# Patient Record
Sex: Male | Born: 1945 | Race: White | Hispanic: No | Smoking: Current every day smoker
Health system: Southern US, Community
[De-identification: ages and names within clinical notes are randomized; demographics above are authoritative.]

## PROBLEM LIST (undated history)

## (undated) DIAGNOSIS — I639 Cerebral infarction, unspecified: Secondary | ICD-10-CM

## (undated) DIAGNOSIS — I10 Essential (primary) hypertension: Secondary | ICD-10-CM

## (undated) HISTORY — PX: HIP ARTHROPLASTY: SHX981

---

## 2017-02-21 ENCOUNTER — Encounter (HOSPITAL_COMMUNITY): Payer: Self-pay | Admitting: Nurse Practitioner

## 2017-02-21 ENCOUNTER — Emergency Department (HOSPITAL_COMMUNITY): Payer: Medicare Other

## 2017-02-21 ENCOUNTER — Emergency Department (HOSPITAL_COMMUNITY)
Admission: EM | Admit: 2017-02-21 | Discharge: 2017-02-22 | Disposition: A | Discharge status: Home / Self Care | Payer: Medicare Other | Attending: Emergency Medicine | Admitting: Emergency Medicine

## 2017-02-21 DIAGNOSIS — F17210 Nicotine dependence, cigarettes, uncomplicated: Secondary | ICD-10-CM | POA: Diagnosis not present

## 2017-02-21 DIAGNOSIS — N3000 Acute cystitis without hematuria: Secondary | ICD-10-CM | POA: Diagnosis not present

## 2017-02-21 DIAGNOSIS — R262 Difficulty in walking, not elsewhere classified: Secondary | ICD-10-CM | POA: Insufficient documentation

## 2017-02-21 DIAGNOSIS — M25551 Pain in right hip: Secondary | ICD-10-CM

## 2017-02-21 HISTORY — DX: Cerebral infarction, unspecified: I63.9

## 2017-02-21 HISTORY — DX: Essential (primary) hypertension: I10

## 2017-02-21 LAB — URINALYSIS, ROUTINE W REFLEX MICROSCOPIC
Bilirubin Urine: NEGATIVE
Glucose, UA: NEGATIVE mg/dL
Ketones, ur: NEGATIVE mg/dL
Nitrite: NEGATIVE
Protein, ur: 30 mg/dL — AB
Specific Gravity, Urine: 1.01 (ref 1.005–1.030)
Squamous Epithelial / LPF: NONE SEEN
pH: 7 (ref 5.0–8.0)

## 2017-02-21 LAB — CBC WITH DIFFERENTIAL/PLATELET
Basophils Absolute: 0 10*3/uL (ref 0.0–0.1)
Basophils Relative: 1 %
Eosinophils Absolute: 0.2 10*3/uL (ref 0.0–0.7)
Eosinophils Relative: 3 %
HCT: 31.6 % — ABNORMAL LOW (ref 39.0–52.0)
Hemoglobin: 9.6 g/dL — ABNORMAL LOW (ref 13.0–17.0)
Lymphocytes Relative: 14 %
Lymphs Abs: 0.9 10*3/uL (ref 0.7–4.0)
MCH: 24.9 pg — ABNORMAL LOW (ref 26.0–34.0)
MCHC: 30.4 g/dL (ref 30.0–36.0)
MCV: 81.9 fL (ref 78.0–100.0)
Monocytes Absolute: 0.9 10*3/uL (ref 0.1–1.0)
Monocytes Relative: 13 %
Neutro Abs: 4.6 10*3/uL (ref 1.7–7.7)
Neutrophils Relative %: 69 %
Platelets: 235 10*3/uL (ref 150–400)
RBC: 3.86 MIL/uL — ABNORMAL LOW (ref 4.22–5.81)
RDW: 16.2 % — ABNORMAL HIGH (ref 11.5–15.5)
WBC: 6.6 10*3/uL (ref 4.0–10.5)

## 2017-02-21 LAB — CK: Total CK: 102 U/L (ref 49–397)

## 2017-02-21 LAB — BASIC METABOLIC PANEL
Anion gap: 8 (ref 5–15)
BUN: 18 mg/dL (ref 6–20)
CO2: 29 mmol/L (ref 22–32)
Calcium: 9 mg/dL (ref 8.9–10.3)
Chloride: 100 mmol/L — ABNORMAL LOW (ref 101–111)
Creatinine, Ser: 1.56 mg/dL — ABNORMAL HIGH (ref 0.61–1.24)
GFR calc Af Amer: 51 mL/min — ABNORMAL LOW (ref >60)
GFR calc non Af Amer: 44 mL/min — ABNORMAL LOW (ref >60)
Glucose, Bld: 97 mg/dL (ref 65–99)
Potassium: 3.1 mmol/L — ABNORMAL LOW (ref 3.5–5.1)
Sodium: 137 mmol/L (ref 135–145)

## 2017-02-21 MED ORDER — MORPHINE SULFATE (PF) 4 MG/ML IV SOLN
4.0000 mg | Freq: Once | INTRAVENOUS | Status: AC
  Administered 2017-02-21: 4 mg via INTRAVENOUS
  Filled 2017-02-21: qty 1

## 2017-02-21 MED ORDER — CEPHALEXIN 500 MG PO CAPS
500.0000 mg | ORAL_CAPSULE | Freq: Once | ORAL | Status: AC
  Administered 2017-02-21: 500 mg via ORAL
  Filled 2017-02-21: qty 1

## 2017-02-21 MED ORDER — MORPHINE SULFATE (PF) 2 MG/ML IV SOLN
2.0000 mg | Freq: Once | INTRAVENOUS | Status: AC
  Administered 2017-02-21: 2 mg via INTRAVENOUS
  Filled 2017-02-21: qty 1

## 2017-02-21 MED ORDER — SODIUM CHLORIDE 0.9 % IV BOLUS (SEPSIS)
1000.0000 mL | Freq: Once | INTRAVENOUS | Status: AC
Start: 2017-02-21 — End: 2017-02-21
  Administered 2017-02-21: 1000 mL via INTRAVENOUS

## 2017-02-21 MED ORDER — CEPHALEXIN 500 MG PO CAPS: 500.0000 mg | ORAL_CAPSULE | Freq: Three times a day (TID) | ORAL | 0 refills | Status: AC

## 2017-02-21 NOTE — ED Notes (Signed)
Bed: [id number] Expected date:  Expected time:  Means of arrival:  Comments: Boarder in 19

## 2017-02-21 NOTE — Progress Notes (Signed)
ED CSW just arrived on duty.  Consult request has been received. CSW attempting to follow up at present time.  Dorothe PeaJonathan F. Vayda Dungee, Francesco SorLCSWA, LCAS, CSI Clinical Social Worker Ph: [phone]

## 2017-02-21 NOTE — ED Triage Notes (Signed)
Pt arrived via ems with R hip pain. Pt is homeless and was picked up from a hotel. He uses a walker. Pt states he does have a hx of htn and not taking medications over the past 2 weeks. Pt reports he has only eaten a piece of chicken over the past 3 days and otherwise only water.  A&O x4, patient reports he is currently unable to ambulate due to the pain. Denies any falls or injury to hip other than hip in 2014. Patient reports to ems that pain is chronic but seems worst over the past 2-3 days. VS: 150/92, CBG 113, 80, 18, 99% RA,  NSR.

## 2017-02-21 NOTE — ED Provider Notes (Signed)
[facility] DEPT Provider Note   CSN: [id number] Arrival date & time: 02/21/17  1241     History   Chief Complaint Chief Complaint  Patient presents with  . Hip Pain    HPI Robert Romero is a 71 y.o. male with history of hypertension who presents today with chief complaint acute worsening of chronic right hip pain secondary to fall 3 days ago. He states that he has had right hip pain for the past 4 years and states "I broke my hip and femur twice. I have 5 screws in my hip and a plate in my ankle and ever since then it's all gone to [place]". He states that he uses a walker to ambulate, but 3 days ago he tripped and fell forward. He denies hitting his head or losing consciousness. Since then he has been experiencing constant sharp right hip pain which radiates to his knee. He states he has not been able to ambulate due to the pain. Endorses numbness to this area which is constant and unchanged for several years. Has not tried anything for his symptoms. Also states he cannot straighten his leg, and has to keep his right knee flexed, but states this has been chronic for years. He is a pack a day smoker for several decades. States he has not been able to have his hypertension medication for over 2 weeks now, and states he is homeless and has only had a piece of chicken to eat in 2 days. Denies abdominal pain, n/v/d, back pain, CP, SOB, or headache.  The history is provided by the patient.    Past Medical History:  Diagnosis Date  . Hypertension   . Stroke [facility])     There are no active problems to display for this patient.   Past Surgical History:  Procedure Laterality Date  . HIP ARTHROPLASTY Right        Home Medications    Prior to Admission medications   Not on File    Family History History reviewed. No pertinent family history.  Social History Social History  Substance Use Topics  . Smoking status: Current Every Day Smoker    Packs/day: 0.50    Types: Cigarettes  .  Smokeless tobacco: Never Used  . Alcohol use 1.8 - 2.4 oz/week    3 - 4 Cans of beer per week     Comment: drinks when he can get money to get beer. Last beer on 9/5 2 cans      Allergies   Patient has no known allergies.   Review of Systems Review of Systems  Constitutional: Negative for chills and fever.  Respiratory: Negative for shortness of breath.   Cardiovascular: Negative for chest pain.  Gastrointestinal: Negative for abdominal pain, diarrhea, nausea and vomiting.  Musculoskeletal: Positive for arthralgias (r hip) and gait problem. Negative for back pain.  Neurological: Positive for weakness (right hip, chronic) and numbness (chronic, unchanged). Negative for syncope and headaches.     Physical Exam Updated Vital Signs BP (!) 168/95 (BP Location: Left Arm)   Pulse 72   Temp 97.6 F (36.4 C) (Oral)   Resp 16   Ht  (1.88 m)   Wt 68 kg (150 lb)   SpO2 100%   BMI 19.26 kg/m   Physical Exam  Constitutional: No distress.  Chronically ill-appearing, very thin. Fingernails and toenails are very long and dirty. Unkempt appearance.  HENT:  Head: Normocephalic and atraumatic.  Eyes: Pupils are equal, round, and reactive to  light. Conjunctivae and EOM are normal. Right eye exhibits no discharge. Left eye exhibits no discharge.  Neck: Normal range of motion. Neck supple. No tracheal deviation present.  Cardiovascular: Normal rate, regular rhythm and normal heart sounds.   Diminished DP/PT pulses equally and bilaterally,  Homans sign absent bilaterally  Pulmonary/Chest: Effort normal. He has wheezes. He exhibits no tenderness.  No increased work of breathing, scattered wheezes and rhonchi diffusely, has barrel chest  Abdominal: Soft. Bowel sounds are normal. He exhibits no distension. There is no tenderness.  Musculoskeletal:  Right hip with decreased range of motion in all directions. 4+/5 strength of right hip flexor and lateral hip motion against resistant. 5/5  strength of the BLE major muscle groups otherwise including bl EHL. Keeps R hip and knee flexed, unable to straighten leg so unable to assess leg length discrepancy. TTP along the lateral right thigh without deformity, crepitus, or swelling.   No midline spine TTP, no paraspinal muscle tenderness, no deformity, crepitus, or step-off noted. No SIJ tenderness.   Neurological: He is alert. A sensory deficit is present.  Alert and oriented to person only. Has some difficulty answering questions regarding time. Fluent speech, no facial droop, decreased sensation of the right lower extremity on soft touch as compared to the left lower extremity. He says this has been chronic for several years. Refuses to ambulate due to pain.  Skin: Skin is warm and dry. No erythema.  Psychiatric: He has a normal mood and affect. His behavior is normal.     ED Treatments / Results  Labs (all labs ordered are listed, but only abnormal results are displayed) Labs Reviewed  CBC WITH DIFFERENTIAL/PLATELET - Abnormal; Notable for the following:       Result Value   RBC 3.86 (*)    Hemoglobin 9.6 (*)    HCT 31.6 (*)    MCH 24.9 (*)    RDW 16.2 (*)    All other components within normal limits  BASIC METABOLIC PANEL - Abnormal; Notable for the following:    Potassium 3.1 (*)    Chloride 100 (*)    Creatinine, Ser 1.56 (*)    GFR calc non Af Amer 44 (*)    GFR calc Af Amer 51 (*)    All other components within normal limits  URINALYSIS, ROUTINE W REFLEX MICROSCOPIC - Abnormal; Notable for the following:    APPearance HAZY (*)    Hgb urine dipstick SMALL (*)    Protein, ur 30 (*)    Leukocytes, UA MODERATE (*)    Bacteria, UA MANY (*)    All other components within normal limits  URINE CULTURE  CK    EKG  EKG Interpretation None       Radiology Ct Head Wo Contrast  Result Date: 02/21/2017 CLINICAL DATA:  Unable to ambulate. Hypertension. Poor p.o. intake. EXAM: CT HEAD WITHOUT CONTRAST TECHNIQUE:  Contiguous axial images were obtained from the base of the skull through the vertex without intravenous contrast. COMPARISON:  None. FINDINGS: Brain: There is mild generalized age-related parenchymal atrophy with commensurate dilatation of the ventricles and sulci. Chronic small vessel ischemic changes noted within the bilateral periventricular and subcortical white matter regions. 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 There is no mass, hemorrhage, edema or other evidence of acute parenchymal abnormality. No extra-axial hemorrhage. Vascular: There are chronic calcified atherosclerotic changes of the large vessels at the skull base. No unexpected hyperdense vessel. Skull: Normal. Negative for fracture or focal lesion.  Sinuses/Orbits: No acute finding. Other: None. IMPRESSION: 1. No acute findings.  No intracranial mass, hemorrhage or edema. 2. Chronic small vessel ischemic changes in the white matter and right thalamus. Electronically Signed   By: Bary Richard M.D.   On: 02/21/2017 14:57   Dg Hip Unilat With Pelvis 2-3 Views Right  Result Date: 02/21/2017 CLINICAL DATA:  Right hip pain after fall several days ago. EXAM: DG HIP (WITH OR WITHOUT PELVIS) 2-3V RIGHT COMPARISON:  None. FINDINGS: Status post surgical internal fixation of old proximal femoral neck fracture. Severe degenerative joint disease is seen involving the right hip joint. Vascular calcifications are noted. No evidence of acute fracture or dislocation is seen involving the right femur. IMPRESSION: Status post surgical internal fixation of old proximal femoral neck fracture. No acute right femur fracture is noted. 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 Electronically Signed   By: Lupita Raider, M.D.   On: 02/21/2017 15:10   Dg Femur 1v Right  Result Date: 02/21/2017 CLINICAL DATA:  Right hip pain after fall several days ago. EXAM: RIGHT FEMUR 1 VIEW COMPARISON:  None. FINDINGS: Severe degenerative joint disease  of the right hip joint is noted. Status post surgical internal fixation of old proximal right femoral neck fracture. No acute fracture or dislocation is noted. IMPRESSION: Severe degenerative joint disease of the right hip. Status post surgical internal fixation of old proximal right femoral neck fracture. No acute abnormality is noted. Electronically Signed   By: Lupita Raider, M.D.   On: 02/21/2017 15:08    Procedures Procedures (including critical care time)  Medications Ordered in ED Medications  cephALEXin (KEFLEX) capsule 500 mg (not administered)  sodium chloride 0.9 % bolus 1,000 mL (1,000 mLs Intravenous New Bag/Given 02/21/17 1401)  morphine 2 MG/ML injection 2 mg (2 mg Intravenous Given 02/21/17 1423)  morphine 4 MG/ML injection 4 mg (4 mg Intravenous Given 02/21/17 1827)     Initial Impression / Assessment and Plan / ED Course  I have reviewed the triage vital signs and the nursing notes.  Pertinent labs & imaging results that were available during my care of the patient were reviewed by me and considered in my medical decision making (see chart for details).     Patient with acute on chronic right hip pain. Afebrile, he is hypertensive but has not had his blood pressure medication for 2 weeks. Neurovascularly at his baseline. He has a slight anemia but no leukocytosis. Also has a slight Lee elevated creatinine, but I suspect all of these findings are chronic given the patient is unsure what his medical conditions are and is unsure what medications he takes. CT of the head shows no acute findings but does show chronic small vessel ischemic changes in the white matter in the right thalmus. X-rays of the hip and femur show severe degenerative joint disease of the right hip but no acute changes and no fracture or dislocation. Pain has been managed while in the ED. UA is suggestive of UTI, sent for culture. First dose of Keflex given in the ED.  Patient seen and evaluated by Dr. Madilyn Hook who  agrees with assessment and plan at this time.  Patient is refusing to walk while in the ED, but he is able to pull himself up to a standing position on his walker. He states "you can't let me leave if I am not able to walk ". Patient states he was on his way home to [place] from [place] but the [employer] would not let him get back on during a layover as he had soiled himself, which is why EMTs brought him here. Case management and social work consulted, and patient is awaiting Care Management consult in the AM for wheelchair. He will be discharged to the [facility] in the AM. Patient is agreeable to this plan at this time. We are attempting to obtain old hospital records from [facility] in [place] to ascertain patient's regular medications. Final Clinical Impressions(s) / ED Diagnoses   Final diagnoses:  Right hip pain  Acute cystitis without hematuria    New Prescriptions New Prescriptions   No medications on file     Bennye Alm 02/21/17 2316    Tilden Fossa, MD 02/25/17 1322

## 2017-02-21 NOTE — Progress Notes (Addendum)
CSW met with CM who was familiar with pt's situation via the notes and CSW and CM met with pt.  Pt states he is from [place] and rented a room from a:  Shauna Hugh [place]  Pt reported to [place] and CM pt was en route by [place] train to [place] and that the [place] train took a circuitous route from [place], through [place] up to [employer]. [place] and then was to route back through to the pt's destination in [place].  Pt stated he urinated on himself en route, did not have a change of clothes and that [employer] ejected pt from the train in [place].  Pt stated he was en route to his sister's house in [place] :  [address] [place] [address]  Pt states he has Medicare and Medicaid (in [place]) and registration is verifying now.  Pt states he PCP is a Dr. Glendora Score in [place], [place].  CM witnessed CSW request verbal permission fro pt to contact pt's sister and roommate, pt gave verbal permission.  Pt stated to CSW and CM he had recently called his sister using her number and was unable to reach her and now states he does not have the number for his ex-roommate or his sister.  CSW staffed case with CSW Asst Director who verified pt is alert and oriented X 4 and as such it is the pt's prerogative to track down his family if pt wishes and that since pt presents as being of sound mind pt can supply the phone numbers for CSW to contact family or pt can contact family himself.  Per CSW Asst Director once pt is medically cleared should be given a shelter list and a bus pass and D/C 'd.  Please reconsult if future social work needs arise.  CSW signing off, as social work intervention is no longer needed.  8:36 PM With the verbal permission of the pt provided to the CSW the pt was assessed by Kendra Opitz of the [facility] to [place]:  [facility] to [place]: Kendra Opitz  [address]  [place], [place] [address] Ph: [phone] Fax: [phone]  Miss Rosana Hoes stated she would arrive to the ED on the morning of 02/22/17 to pick pt up and has agreed to transport the pt from the ED in the morning.    CM consult was placed and CM stated CM would coordinate any needed prescribed medications with the EDP and if pt D/C's with any CM will coordiante with EDP to have them sent to the pharmacy.  Pt agreed to sign over $600 of his check to Miss [facility]es per month and agreed to wear "Depends" undergarments until he is strong enough to use his rolling walker he brought to the ED to ambulate to the bathroom.    CSW asked the EDP to please ask tech's to assist with cleaning the pt who was incontinent with urine and CSW will attempt to procure clothing for the pt from the Chaplain's office.  Plan for D/C:   Miss Rosana Hoes at ph: [phone] will call the ED CSW in the morning at ph: [phone] to assist with the pt's D/C and will arrive to take the pt her Transition to [place].  Miss Rosana Hoes will attempt to push the pt while to pt is seated on the pt's seated rolling walker to her vehicle, and in her home and pt can utilize the seated walker in the home.  Pt understands and verbally stated he understood that if pt refused to D/C to [facility] he would be given a shelter list and a bus pass and that if pt returns to the ED from [place] facility and does not need medical treatment, pt will be assessed for the need for medical treatment by a EDP and if pt does not require treatment and is D/C'd pt will again be provided with a shelter list and a bus pass.  Per EDP pt's only medication at D/C will be for Keflex for a UTI and pt will have to be assessed by a PCP after D/C for any additional needed medications unless the records from [place] hospital are sent to the EDP by morning (ED Secretary requested them but the ED did not receive them on 9/7 Friday  evening).  CSW updated pt's RN, EDP and CM.  CSW will leave handoff for CSW on 02/22/17.    Alphonse Guild. Harlie Buening, LCSW, LCAS, CSI Clinical Social Worker Ph: [phone]

## 2017-02-21 NOTE — Care Management (Signed)
[facility] concerning assisting patient with w/c and medication assistance. CM reviewed record patient is uninsured traveling from [place], unfortunately patient is uninsured and not eligible for DME. At this time EDP is unable to obtain med list. CM will follow up in the am for possible medication assistance with [facility].

## 2017-02-21 NOTE — ED Notes (Signed)
Patient transported to CT 

## 2017-02-21 NOTE — Clinical Social Work Note (Signed)
Clinical Social Work Assessment  Patient Details  Name: Robert Romero MRN: [id number] Date of Birth: [birth year]  Date of referral:  02/21/17               Reason for consult:  Facility Placement                Permission sought to share information with:  Facility Art therapist granted to share information::  Yes, Verbal Permission Granted  Name::        Agency::     Relationship::     Contact Information:     Housing/Transportation Living arrangements for the past 2 months:  Homeless Source of Information:  Patient Patient Interpreter Needed:  None Criminal Activity/Legal Involvement Pertinent to Current Situation/Hospitalization:    Significant Relationships:  Siblings Lives with:  Other (Comment) (Homeless) Do you feel safe going back to the place where you live?  No Need for family participation in patient care:  No (Coment)  Care giving concerns:  CSW met with CM who was familiar with pt's situation via the notes and CSW and CM met with pt.  Pt states he is from [place] and rented a room from a:  Shauna Hugh [place]  Pt reported to [place] and CM pt was en route by [place] train to [place] and that the [place] train took a circuitous route from [place], through [place] up to [employer]. [place] and then was to route back through to the pt's destination in [place].  Pt stated he urinated on himself en route, did not have a change of clothes and that [employer] ejected pt from the train in [place].  Pt stated he was en route to his sister's house in [place] :  [address] [place] [address]  Pt states he has Medicare and Medicaid (in [place]) and registration is verifying now.  Pt states he PCP is a Dr. Glendora Score in [place], [place].  CM witnessed CSW request verbal permission fro pt to contact pt's sister and roommate, pt gave verbal permission.  Pt stated to CSW and CM he had recently  called his sister using her number and was unable to reach her and now states he does not have the number for his ex-roommate or his sister.  CSW staffed case with CSW Asst Director who verified pt is alert and oriented X 4 and as such it is the pt's prerogative to track down his family if pt wishes and that since pt presents as being of sound mind pt can supply the phone numbers for CSW to contact family or pt can contact family himself.  Per CSW Asst Director once pt is medically cleared should be given a shelter list and a bus pass and D/C 'd.  Please reconsult if future social work needs arise.  CSW signing off, as social work intervention is no longer needed.  8:36 PM With the verbal permission of the pt provided to the CSW the pt was assessed by Kendra Opitz of the [facility] Transition to [place]:  [facility] [place]: Kendra Opitz  [address] [place], [place] [address] Ph: [phone] Fax: [phone]  Miss Rosana Hoes stated she would arrive to the ED on the morning of 02/22/17 to pick pt up and has agreed to transport the pt from the ED in the morning.    CM consult was placed and CM stated CM would coordinate any needed prescribed medications with the EDP and  if pt D/C's with any CM will coordiante with EDP to have them sent to the pharmacy.  Pt agreed to sign over $600 of his check to [facility] per month and agreed to wear "Depends" undergarments until he is strong enough to use his rolling walker he brought to the ED to ambulate to the bathroom.    CSW asked the EDP to please ask tech's to assist with cleaning the pt who was incontinent with urine and CSW will attempt to procure clothing for the pt from the Chaplain's office.    Social Worker assessment / plan:  CSW met with pt and confirmed pt's plan to be discharged to Miss Rosana Hoes at ph: [phone] with her Transition to [place] to live at discharge.  CSW  provided active listening and validated pt's concerns. Pt has been homeless prior to being admitted to [facility].  Miss Rosana Hoes at ph: [phone] will call the ED CSW in the morning at ph: [phone] to assist with the pt's D/C and will arrive to take the pt her Transition to [place].  Miss Rosana Hoes will attempt to push the pt while to pt is seated on the pt's seated rolling walker to her vehicle, and in her home and pt can utilize the seated walker in the home.    Pt understands and verbally stated he understood that if pt refused to D/C to [facility] he would be given a shelter list and a bus pass and that if pt returns to the ED from [place] facility and does not need medical treatment, pt will be assessed for the need for medical treatment by a EDP and if pt does not require treatment and is D/C'd pt will again be provided with a shelter list and a bus pass.  Per EDP pt's only medication at D/C will be for Keflex for a UTI and pt will have to be assessed by a PCP after D/C for any additional needed medications unless the records from [place] hospital are sent to the EDP by morning (ED Secretary requested them but the ED did not receive them on 9/7 Friday evening).  Employment status:  Retired Forensic scientist:  Self Pay (Medicaid Pending) (Pt states he has Medicare and [place] but ED cannot verify, pt has no proof) PT Recommendations:  Not assessed at this time Information / Referral to community resources:     Patient/Family's Response to care:  Patient alert and oriented.  Patient agreeable to plan.  Pt states he has a sister supportive and strongly involved in pt.'s care.  Pt pleasant and appreciated CSW intervention.    Patient/Family's Understanding of and Emotional Response to Diagnosis, Current Treatment, and Prognosis:  Still assessing  Emotional Assessment Appearance:  Appears stated age Attitude/Demeanor/Rapport:    Affect (typically  observed):  Agitated, Guarded, Apprehensive, Overwhelmed Orientation:  Oriented to Self, Oriented to Place, Oriented to Situation, Oriented to  Time Alcohol / Substance use:   (Pt denies) Psych involvement (Current and /or in the community):     Discharge Needs  Concerns to be addressed:  Care Coordination Readmission within the last 30 days:  No Current discharge risk:  Dependent with Mobility Barriers to Discharge:  No Barriers Identified   Claudine Mouton, LCSWA 02/21/2017, 10:21 PM

## 2017-02-22 DIAGNOSIS — M25551 Pain in right hip: Secondary | ICD-10-CM | POA: Diagnosis not present

## 2017-02-22 MED ORDER — CEPHALEXIN 500 MG PO CAPS: 500.0000 mg | ORAL_CAPSULE | Freq: Two times a day (BID) | ORAL | Status: DC

## 2017-02-22 MED ORDER — AMLODIPINE BESYLATE 5 MG PO TABS: 5.0000 mg | ORAL_TABLET | Freq: Every day | ORAL | 0 refills | Status: DC

## 2017-02-22 MED ORDER — ACETAMINOPHEN 325 MG PO TABS
650.0000 mg | ORAL_TABLET | Freq: Four times a day (QID) | ORAL | Status: DC | PRN
  Administered 2017-02-22: 650 mg via ORAL
  Filled 2017-02-22: qty 2

## 2017-02-22 MED ORDER — OXYCODONE-ACETAMINOPHEN 5-325 MG PO TABS
1.0000 | ORAL_TABLET | ORAL | Status: DC | PRN
  Administered 2017-02-22: 1 via ORAL
  Filled 2017-02-22: qty 1

## 2017-02-22 NOTE — ED Provider Notes (Signed)
Patient was handed off to me by previous ED PA pending case management consult. Please see previous ED PA note for full history of present illness, are within physical exam. Briefly patient is a 71 year old male with history of hypertension, untreated, who presented to ED for worsening acute on chronic right hip pain after fall 3 days ago. Basic blood work and imaging was done by previous ED PA, without acute abnormalities except for UTI. Patient was to be discharged after first dose of Keflex however he refused to walk in the ED. Case management and social worker consulted. Plan was to discharge him to [facility] in the morning.  I was contacted by case management because representative from independent living home was in the emergency department and was not sure if she would be able to take the patient because patient does not have [facility] Medicaid or health insurance, she would have not been compensated. Case management assisted in looking into other living facilities including shelters. Representative from assisted living facility eventually agreed to take the patient despite lack of insurance. I evaluated patient, he was still not willing to ambulate independently. His roll later is broken. PT/OT was consultative who recommended wheelchair. I placed an order for this in the ED. He was discharged in good condition with rep [facility] with rx for keflex and amlodipine.      Liberty HandyGibbons, Shaaron Golliday J, PA-C 02/22/17 1159    Pricilla LovelessGoldston, Scott, MD 02/27/17 [phone]
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
## 2017-02-22 NOTE — Progress Notes (Signed)
CSW spoke with Ms. Davis regarding patient. Per Ms. Earlene PlaterDavis, she will accept the patient into her facility. CM and CSW worked closely to make this a smooth transition for the patient. Currently waiting on discharge. No other concerns to report at this time.   CSW to sign off.   Fernande BoydenJoyce Orlander Norwood, LCSWA Clinical Social Worker [facility] Ph: [phone]

## 2017-02-22 NOTE — Discharge Instructions (Signed)
Take keflex for urinary tract infection  Take amlodipine for blood pressure. Your goal is blood pressure less than 150/90  [facility] to establish care with a primary care provider for regular, routine medical care.  This clinic accepts patients without medical insurance. A primary care provider can adjust your daily medications and give you refills.

## 2017-02-22 NOTE — Progress Notes (Signed)
CSW spoke with PT regarding evaluation. Per Pt, patient is unable to ambulate at this moment and will need moderate assistance. CSW contacted Ms. [facility] at [facility] to see if they are able to meet the needs of the patient. Per Ms. Earlene PlaterDavis, she will be arriving at the hospital around 9am and will assess patient. Ms. Earlene PlaterDavis reported she will follow up with CSW regarding plan.   CSW awaiting phone call back from representative. CSW will continue to follow and provide support to patient while in the hospital.   Fernande BoydenJoyce Ryken Paschal, [facility] Clinical Social Worker [facility] Ph: [phone]

## 2017-02-22 NOTE — Care Management Note (Signed)
Met with pt and Ms. Davis from [facility]. D/C plan is for [facility] go to [facility]. Pt needs a W/C. Obtained pt's Medicare card and contacted Jermaine at [place] for DME referral. W/C delivered by [facility].  Pt needs assistance with his meds. Assisted pt with his new prescriptions thought the hospital [facility] program.

## 2017-02-24 LAB — URINE CULTURE: Culture: >=100000 — AB

## 2017-02-24 NOTE — Progress Notes (Signed)
   02/22/17 [id number]  PT Time Calculation  PT Start Time (ACUTE ONLY) 0753  PT Stop Time (ACUTE ONLY) 0833  PT Time Calculation (min) (ACUTE ONLY) 40 min  PT G-Codes **NOT FOR INPATIENT CLASS**  Functional Assessment Tool Used AM-PAC 6 Clicks Basic Mobility  Functional Limitation Mobility: Walking and moving around  Mobility: Walking and Moving Around Current Status (J1914(G8978) CM  Mobility: Walking and Moving Around Goal Status (N8295(G8979) CK  PT General Charges  $$ ACUTE PT VISIT 1 Visit  PT Evaluation  $PT Eval Moderate Complexity 1 Mod  PT Treatments  $Therapeutic Activity 23-37 mins

## 2017-02-25 ENCOUNTER — Telehealth: Payer: Self-pay | Admitting: *Deleted

## 2017-02-25 NOTE — Telephone Encounter (Signed)
Post ED Visit - Positive Culture Follow-up  Culture report reviewed by antimicrobial stewardship pharmacist:  []  Enzo BiNathan Batchelder, Pharm.D. []  Celedonio MiyamotoJeremy Frens, Pharm.D., BCPS AQ-ID []  Garvin FilaMike Maccia, Pharm.D., BCPS []  Georgina PillionElizabeth Martin, [address] BCPS []  [place]Minh Pham, [address] BCPS, AAHIVP [x]  Estella HuskMichelle Turner, Pharm.D., BCPS, AAHIVP []  Lysle Pearlachel Rumbarger, PharmD, BCPS []  Casilda Carlsaylor Stone, PharmD, BCPS []  Pollyann SamplesAndy Johnston, PharmD, BCPS  Positive urine culture Treated with Cephalexin, organism sensitive to the same and no further patient follow-up is required at this time.  Virl AxeRobertson, Calieb Lichtman [facility] 02/25/2017, 12:25 PM

## 2017-03-13 ENCOUNTER — Encounter (HOSPITAL_COMMUNITY): Payer: Self-pay | Admitting: Family Medicine

## 2017-03-13 ENCOUNTER — Inpatient Hospital Stay (HOSPITAL_COMMUNITY)
Admission: EM | Admit: 2017-03-13 | Discharge: 2017-03-17 | DRG: 603 | Disposition: A | Discharge status: Skilled Nursing Facility | Payer: Medicare Other | Attending: Internal Medicine | Admitting: Internal Medicine

## 2017-03-13 ENCOUNTER — Emergency Department (HOSPITAL_COMMUNITY): Payer: Medicare Other

## 2017-03-13 DIAGNOSIS — N179 Acute kidney failure, unspecified: Secondary | ICD-10-CM | POA: Diagnosis present

## 2017-03-13 DIAGNOSIS — L03115 Cellulitis of right lower limb: Secondary | ICD-10-CM | POA: Diagnosis present

## 2017-03-13 DIAGNOSIS — E876 Hypokalemia: Secondary | ICD-10-CM | POA: Diagnosis present

## 2017-03-13 DIAGNOSIS — D649 Anemia, unspecified: Secondary | ICD-10-CM | POA: Diagnosis not present

## 2017-03-13 DIAGNOSIS — L089 Local infection of the skin and subcutaneous tissue, unspecified: Secondary | ICD-10-CM

## 2017-03-13 DIAGNOSIS — Z9114 Patient's other noncompliance with medication regimen: Secondary | ICD-10-CM | POA: Diagnosis not present

## 2017-03-13 DIAGNOSIS — D638 Anemia in other chronic diseases classified elsewhere: Secondary | ICD-10-CM | POA: Diagnosis present

## 2017-03-13 DIAGNOSIS — I10 Essential (primary) hypertension: Secondary | ICD-10-CM | POA: Diagnosis present

## 2017-03-13 DIAGNOSIS — I69351 Hemiplegia and hemiparesis following cerebral infarction affecting right dominant side: Secondary | ICD-10-CM | POA: Diagnosis not present

## 2017-03-13 DIAGNOSIS — G8929 Other chronic pain: Secondary | ICD-10-CM | POA: Diagnosis present

## 2017-03-13 DIAGNOSIS — Z96641 Presence of right artificial hip joint: Secondary | ICD-10-CM | POA: Diagnosis present

## 2017-03-13 DIAGNOSIS — F17210 Nicotine dependence, cigarettes, uncomplicated: Secondary | ICD-10-CM | POA: Diagnosis present

## 2017-03-13 DIAGNOSIS — N390 Urinary tract infection, site not specified: Secondary | ICD-10-CM | POA: Diagnosis present

## 2017-03-13 DIAGNOSIS — Z590 Homelessness: Secondary | ICD-10-CM | POA: Diagnosis not present

## 2017-03-13 LAB — COMPREHENSIVE METABOLIC PANEL
ALBUMIN: 4.1 g/dL (ref 3.5–5.0)
ALT: 19 U/L (ref 17–63)
ANION GAP: 8 (ref 5–15)
AST: 20 U/L (ref 15–41)
Alkaline Phosphatase: 120 U/L (ref 38–126)
BUN: 14 mg/dL (ref 6–20)
CO2: 27 mmol/L (ref 22–32)
Calcium: 9.1 mg/dL (ref 8.9–10.3)
Chloride: 104 mmol/L (ref 101–111)
Creatinine, Ser: 1.21 mg/dL (ref 0.61–1.24)
GFR calc non Af Amer: 59 mL/min — ABNORMAL LOW (ref >60)
GLUCOSE: 121 mg/dL — AB (ref 65–99)
POTASSIUM: 3.1 mmol/L — AB (ref 3.5–5.1)
SODIUM: 139 mmol/L (ref 135–145)
TOTAL PROTEIN: 8.3 g/dL — AB (ref 6.5–8.1)
Total Bilirubin: 0.8 mg/dL (ref 0.3–1.2)

## 2017-03-13 LAB — CBC WITH DIFFERENTIAL/PLATELET
BASOS ABS: 0 10*3/uL (ref 0.0–0.1)
BASOS PCT: 0 %
EOS ABS: 0.2 10*3/uL (ref 0.0–0.7)
EOS PCT: 2 %
HEMATOCRIT: 34.5 % — AB (ref 39.0–52.0)
Hemoglobin: 10.3 g/dL — ABNORMAL LOW (ref 13.0–17.0)
Lymphocytes Relative: 9 %
Lymphs Abs: 0.9 10*3/uL (ref 0.7–4.0)
MCH: 23.8 pg — ABNORMAL LOW (ref 26.0–34.0)
MCHC: 29.9 g/dL — AB (ref 30.0–36.0)
MCV: 79.7 fL (ref 78.0–100.0)
MONO ABS: 0.8 10*3/uL (ref 0.1–1.0)
MONOS PCT: 8 %
Neutro Abs: 8.4 10*3/uL — ABNORMAL HIGH (ref 1.7–7.7)
Neutrophils Relative %: 81 %
PLATELETS: 331 10*3/uL (ref 150–400)
RBC: 4.33 MIL/uL (ref 4.22–5.81)
RDW: 16.4 % — AB (ref 11.5–15.5)
WBC: 10.3 10*3/uL (ref 4.0–10.5)

## 2017-03-13 LAB — URINALYSIS, ROUTINE W REFLEX MICROSCOPIC
BILIRUBIN URINE: NEGATIVE
Glucose, UA: NEGATIVE mg/dL
KETONES UR: NEGATIVE mg/dL
Nitrite: POSITIVE — AB
PH: 5 (ref 5.0–8.0)
PROTEIN: 100 mg/dL — AB
SQUAMOUS EPITHELIAL / LPF: NONE SEEN
Specific Gravity, Urine: 1.015 (ref 1.005–1.030)

## 2017-03-13 LAB — URIC ACID: Uric Acid, Serum: 2.9 mg/dL — ABNORMAL LOW (ref 4.4–7.6)

## 2017-03-13 LAB — TROPONIN I: TROPONIN I: 0.03 ng/mL — AB (ref <0.03)

## 2017-03-13 LAB — TSH: TSH: 1.603 u[IU]/mL (ref 0.350–4.500)

## 2017-03-13 LAB — MAGNESIUM: MAGNESIUM: 1.7 mg/dL (ref 1.7–2.4)

## 2017-03-13 MED ORDER — SODIUM CHLORIDE 0.9 % IV SOLN
INTRAVENOUS | Status: DC
  Administered 2017-03-13 (×3): via INTRAVENOUS

## 2017-03-13 MED ORDER — SODIUM CHLORIDE 0.9 % IV SOLN
INTRAVENOUS | Status: DC
  Administered 2017-03-13: 12:00:00 via INTRAVENOUS

## 2017-03-13 MED ORDER — VANCOMYCIN HCL 10 G IV SOLR
1250.0000 mg | INTRAVENOUS | Status: DC
  Administered 2017-03-14: 1250 mg via INTRAVENOUS
  Filled 2017-03-13 (×2): qty 1250

## 2017-03-13 MED ORDER — ACETAMINOPHEN 325 MG PO TABS
650.0000 mg | ORAL_TABLET | Freq: Four times a day (QID) | ORAL | Status: DC | PRN
  Administered 2017-03-14: 650 mg via ORAL
  Filled 2017-03-13: qty 2

## 2017-03-13 MED ORDER — ENOXAPARIN SODIUM 40 MG/0.4ML ~~LOC~~ SOLN
40.0000 mg | SUBCUTANEOUS | Status: DC
  Administered 2017-03-13 – 2017-03-16 (×4): 40 mg via SUBCUTANEOUS
  Filled 2017-03-13 (×5): qty 0.4

## 2017-03-13 MED ORDER — AMLODIPINE BESYLATE 5 MG PO TABS
5.0000 mg | ORAL_TABLET | Freq: Once | ORAL | Status: AC
  Administered 2017-03-13: 5 mg via ORAL
  Filled 2017-03-13: qty 1

## 2017-03-13 MED ORDER — LEVALBUTEROL HCL 0.63 MG/3ML IN NEBU: 0.6300 mg | INHALATION_SOLUTION | Freq: Four times a day (QID) | RESPIRATORY_TRACT | Status: DC | PRN

## 2017-03-13 MED ORDER — ONDANSETRON HCL 4 MG/2ML IJ SOLN: 4.0000 mg | Freq: Four times a day (QID) | INTRAMUSCULAR | Status: DC | PRN

## 2017-03-13 MED ORDER — ONDANSETRON HCL 4 MG PO TABS: 4.0000 mg | ORAL_TABLET | Freq: Four times a day (QID) | ORAL | Status: DC | PRN

## 2017-03-13 MED ORDER — VANCOMYCIN HCL IN DEXTROSE 1-5 GM/200ML-% IV SOLN
1000.0000 mg | Freq: Once | INTRAVENOUS | Status: AC
  Administered 2017-03-13: 1000 mg via INTRAVENOUS
  Filled 2017-03-13: qty 200

## 2017-03-13 MED ORDER — VANCOMYCIN HCL 500 MG IV SOLR
500.0000 mg | Freq: Once | INTRAVENOUS | Status: AC
  Administered 2017-03-13: 500 mg via INTRAVENOUS
  Filled 2017-03-13: qty 500

## 2017-03-13 MED ORDER — AMLODIPINE BESYLATE 10 MG PO TABS
10.0000 mg | ORAL_TABLET | Freq: Every day | ORAL | Status: DC
  Administered 2017-03-14 – 2017-03-17 (×4): 10 mg via ORAL
  Filled 2017-03-13 (×4): qty 1

## 2017-03-13 MED ORDER — ACETAMINOPHEN 650 MG RE SUPP: 650.0000 mg | Freq: Four times a day (QID) | RECTAL | Status: DC | PRN

## 2017-03-13 MED ORDER — SODIUM CHLORIDE 0.9 % IV BOLUS (SEPSIS)
500.0000 mL | Freq: Once | INTRAVENOUS | Status: AC
  Administered 2017-03-13: 500 mL via INTRAVENOUS

## 2017-03-13 MED ORDER — TRAMADOL HCL 50 MG PO TABS
50.0000 mg | ORAL_TABLET | Freq: Four times a day (QID) | ORAL | Status: AC
  Administered 2017-03-13: 50 mg via ORAL
  Filled 2017-03-13 (×2): qty 1

## 2017-03-13 MED ORDER — ONDANSETRON HCL 4 MG/2ML IJ SOLN
4.0000 mg | Freq: Once | INTRAMUSCULAR | Status: AC
  Administered 2017-03-13: 4 mg via INTRAVENOUS
  Filled 2017-03-13: qty 2

## 2017-03-13 NOTE — Care Management Note (Signed)
Case Management Note  ED secretary contacted CM stating the pt's medical record from [facility] had been faxed.  CM noted pt's correct name spelling for Medicare is [facility] not [place].  Registration was able to verify his Medicare coverage.  Additionally noted the address pt had provided for [address] was incorrect.  Updated information in pts demographics.  Noted pt's response history for Medicare showed pt's address at [address], [place].  CM will continue to follow.

## 2017-03-13 NOTE — ED Notes (Signed)
Obtained one set of blood culture prior to Vancomycin admin.

## 2017-03-13 NOTE — Progress Notes (Signed)
Patient's wheelchair is at the homeless shelter

## 2017-03-13 NOTE — Care Management Note (Signed)
Case Management Note  CM noted pt's return to the ED.  CM was under the impression that on 02/21/17, CSW was going to assist pt in getting in contact with is family in [place] and check with [employer] about ticket reimbursement/change ticket dates for pt to continue his journey to [place], per note this was not done.CM additionally noted that the medical records requested on 02/21/17 from [facility] are not in the chart and that ins information was not updated after a CM reported on 02/22/17 having his Medicare card for DME purposes.  CM asked registration if they would search the Medicare website for verification based on his SSN, but they were unable to find it.  Advised he probably has out of state [facility] Medicaid which they were able to find and attach to pt's chart.  Called Clydie Braun with [facility] to see if they had a record of his Medicare card number, but they have no record of the pt at all.  Additionally asked the ED secretary to attempt to get the pt's medical records again from [facility], including insurance and emergency contact information.  Used 411.com to reverse the pt's sisters address and found 3 possible phone numbers for pt's nephew.    Spoke with pt who stated he still has been unable to get in touch with family and hasn't spoke to his sister since he left [place].  He reports that he has been staying with Miss Earlene Plater but she has not helped him get in touch with family either.  Provided pt with the 3 phone numbers CM found online for nephew, Carleene Cooper, [phone], [phone], [phone].  Pt called all numbers with no answer and no option for VM.  Pt states that Riki Rusk would be at work right now though so he is not surprised there was no answer and will try calling him again later. Pt reports he's lost his billfold.  CM will need to continue to follow on the inpt unit as needed.

## 2017-03-13 NOTE — ED Notes (Addendum)
Pt has been very uncooperative and disrespectful to staff, refusing BP check. Asked to go out to smoke a cigarette. This RN  Explained  that smoking  is against hospital policy, also offered nicotine patch. Yet patient refused.  Pt found smoking cigarette in the room , security called to bedside.

## 2017-03-13 NOTE — ED Notes (Signed)
RN notified of abnormal lab 

## 2017-03-13 NOTE — ED Notes (Signed)
RN will collect labs at IV start 

## 2017-03-13 NOTE — ED Triage Notes (Signed)
Patient was picked up from a homeless shelter and transported via [place]. Patients right foot is swollen, red, inflamed for the last two days. Patient uses manuel wheelchair and it was left at the homeless shelter.

## 2017-03-13 NOTE — ED Provider Notes (Signed)
[facility]-EMERGENCY DEPT Provider Note   CSN: [id number] Arrival date & time: 03/13/17  7829     History   Chief Complaint Chief Complaint  Patient presents with  . Foot Swelling    HPI Robert Romero is a 71 y.o. male.  Patient brought in from a homeless shelter. Patient with redness and swelling to right foot also complaining of pain to that area. Patient also has a history of high blood pressure. Patient is on Norvasc for high blood pressure. Patient denies any fevers. Any injury. Any wounds.      Past Medical History:  Diagnosis Date  . Hypertension   . Stroke [facility])     Patient Active Problem List   Diagnosis Date Noted  . Right foot infection 03/13/2017  . Cellulitis of right foot 03/13/2017  . Cellulitis of right lower extremity   . Essential hypertension     Past Surgical History:  Procedure Laterality Date  . HIP ARTHROPLASTY Right        Home Medications    Prior to Admission medications   Medication Sig Start Date End Date Taking? Authorizing Provider  amLODipine (NORVASC) 5 MG tablet Take 1 tablet (5 mg total) by mouth daily. Patient not taking: Reported on 03/13/2017 02/22/17   Liberty Handy, PA-C    Family History History reviewed. No pertinent family history.  Social History Social History  Substance Use Topics  . Smoking status: Current Every Day Smoker    Packs/day: 1.00    Types: Cigarettes  . Smokeless tobacco: Never Used  . Alcohol use 1.8 - 2.4 oz/week    3 - 4 Cans of beer per week     Comment: 2-3 Beers a week     Allergies   Patient has no known allergies.   Review of Systems Review of Systems  Constitutional: Negative for fever.  HENT: Negative for congestion.   Eyes: Negative for redness.  Cardiovascular: Positive for leg swelling.  Gastrointestinal: Negative for abdominal pain.  Genitourinary: Negative for dysuria.  Musculoskeletal: Negative for back pain.  Skin: Negative for wound.  Neurological: Negative  for headaches.  Hematological: Does not bruise/bleed easily.  Psychiatric/Behavioral: Negative for confusion.     Physical Exam Updated Vital Signs BP (!) 145/65   Pulse 69   Temp 98 F (36.7 C) (Oral)   Resp 18   Ht 1.88 m ( )   Wt 68 kg (150 lb)   SpO2 99%   BMI 19.26 kg/m   Physical Exam  Constitutional: He is oriented to person, place, and time. He appears well-developed and well-nourished. No distress.  HENT:  Head: Normocephalic and atraumatic.  Mouth/Throat: Oropharynx is clear and moist.  Eyes: Pupils are equal, round, and reactive to light. Conjunctivae and EOM are normal.  Neck: Normal range of motion. Neck supple.  Cardiovascular: Normal rate, regular rhythm and normal heart sounds.   Pulmonary/Chest: Effort normal and breath sounds normal. No respiratory distress.  Abdominal: Soft. Bowel sounds are normal. There is no tenderness.  Musculoskeletal: He exhibits edema and tenderness.  No swelling to the legs. Right foot was significant swelling and medial erythema consistent with cellulitis. Left foot has some swelling but does not have the significant redness. Refill in both feet is normal.  Neurological: He is alert and oriented to person, place, and time. No cranial nerve deficit or sensory deficit. He exhibits normal muscle tone. Coordination normal.  Skin: Skin is warm.  Nursing note and vitals reviewed.    ED  Treatments / Results  Labs (all labs ordered are listed, but only abnormal results are displayed) Labs Reviewed  CBC WITH DIFFERENTIAL/PLATELET - Abnormal; Notable for the following:       Result Value   Hemoglobin 10.3 (*)    HCT 34.5 (*)    MCH 23.8 (*)    MCHC 29.9 (*)    RDW 16.4 (*)    Neutro Abs 8.4 (*)    All other components within normal limits  COMPREHENSIVE METABOLIC PANEL - Abnormal; Notable for the following:    Potassium 3.1 (*)    Glucose, Bld 121 (*)    Total Protein 8.3 (*)    GFR calc non Af Amer 59 (*)    All other  components within normal limits  URIC ACID  MAGNESIUM  TSH  TROPONIN I  TROPONIN I  TROPONIN I  URINALYSIS, ROUTINE W REFLEX MICROSCOPIC    EKG  EKG Interpretation None       Radiology Dg Foot Complete Right  Result Date: 03/13/2017 CLINICAL DATA:  Swollen inflamed appearing right foot for the past 2 days. History of previous CVA, current smoker. EXAM: RIGHT FOOT COMPLETE - 3+ VIEW COMPARISON:  None in PACs FINDINGS: The bones are osteopenic. The phalanges and metatarsals appear intact. The tarsal bones also appear intact. The patient has undergone previous ORIF for medial malleolar and distal fibular fractures. There are no plain radiographic findings to suggest osteomyelitis. There is diffuse soft tissue swelling. IMPRESSION: No definite acute fracture or dislocation is observed. There is diffuse soft tissue swelling without radiographic evidence of osteomyelitis. Electronically Signed   By: David  [place] M.D.   On: 03/13/2017 08:35    Procedures Procedures (including critical care time)  Medications Ordered in ED Medications  0.9 %  sodium chloride infusion ( Intravenous New Bag/Given 03/13/17 1151)  enoxaparin (LOVENOX) injection 40 mg (not administered)  0.9 %  sodium chloride infusion ( Intravenous New Bag/Given 03/13/17 1151)  acetaminophen (TYLENOL) tablet 650 mg (not administered)    Or  acetaminophen (TYLENOL) suppository 650 mg (not administered)  ondansetron (ZOFRAN) tablet 4 mg (not administered)    Or  ondansetron (ZOFRAN) injection 4 mg (not administered)  levalbuterol (XOPENEX) nebulizer solution 0.63 mg (not administered)  vancomycin (VANCOCIN) 1,250 mg in sodium chloride 0.9 % 250 mL IVPB (not administered)  sodium chloride 0.9 % bolus 500 mL (0 mLs Intravenous Stopped 03/13/17 1151)  ondansetron (ZOFRAN) injection 4 mg (4 mg Intravenous Given 03/13/17 0849)  vancomycin (VANCOCIN) IVPB 1000 mg/200 mL premix (0 mg Intravenous Stopped 03/13/17 0954)  amLODipine  (NORVASC) tablet 5 mg (5 mg Oral Given 03/13/17 1048)  vancomycin (VANCOCIN) 500 mg in sodium chloride 0.9 % 100 mL IVPB (0 mg Intravenous Stopped 03/13/17 1420)     Initial Impression / Assessment and Plan / ED Course  I have reviewed the triage vital signs and the nursing notes.  Pertinent labs & imaging results that were available during my care of the patient were reviewed by me and considered in my medical decision making (see chart for details).     Patient with cellulitis to the right foot. Patient edition appears to be homeless is in a homeless shelter. Seems like patient may qualify for nursing home placement. Patient treated here with vancomycin. X-rays of the foot show no bony problems. Patient was hypertensive. Has a history of hypertension do not have his blood pressure medicines today. Was provided here in the ED. Hospitalist will admit.  Final Clinical Impressions(s) /  ED Diagnoses   Final diagnoses:  Cellulitis of right lower extremity  Essential hypertension    New Prescriptions New Prescriptions   No medications on file     Vanetta Mulders, MD 03/13/17 1558

## 2017-03-13 NOTE — H&P (Addendum)
[facility] History and Physical  Vearl Allbaugh Pasch WUJ:[id number] DOB: 04/06/1946 DOA: 03/13/2017  Referring physician:  PCP: Patient, No Pcp Per   Chief Complaint:   HPI:   71 year old male with history of hypertension, untreated, who presented to ED for worsening acute on chronic right hip pain, right ankle pain from a fall 14 days ago. Recently in the ED on 9/8, x-ray did not show any acute abnormalities except for Klebsiella UTI. Patient was discharged on Keflex. Patient is not sure if he completed his Keflex regimen. Patient was begun from the homeless shelter  via [place]. Presented with right foot is swollen, red, inflamed for the last two days.  ED course Patient hypotensive with systolic blood pressure as high as 191, afebrile, X-rays do not show any evidence of acute fracture or osteomyelitis Potassium 3.1, hemoglobin 10.3 Patient is being admitted for possible right ankle cellulitis, hypokalemia , dehydration      Review of Systems: negative for the following  Constitutional: Denies fever, chills, diaphoresis, appetite change and fatigue.  HEENT: Denies photophobia, eye pain, redness, hearing loss, ear pain, congestion, sore throat, rhinorrhea, sneezing, mouth sores, trouble swallowing, neck pain, neck stiffness and tinnitus.  Respiratory: Denies SOB, DOE, cough, chest tightness, and wheezing.  Cardiovascular: Denies chest pain, palpitations and leg swelling.  Gastrointestinal: Denies nausea, vomiting, abdominal pain, diarrhea, constipation, blood in stool and abdominal distention.  Genitourinary: Denies dysuria, urgency, frequency, hematuria, flank pain and difficulty urinating.  Musculoskeletal: Positive for arthralgias (r hip) and gait problem. Negative for back pain.  Neurological: Positive for weakness (right hip, chronic) and numbness (chronic, unchanged). Negative for syncope and headaches.  Skin: Denies pallor, rash and wound.  Hematological: Denies adenopathy.  Easy bruising, personal or family bleeding history  Psychiatric/Behavioral: Denies suicidal ideation, mood changes, confusion, nervousness, sleep disturbance and agitation       Past Medical History:  Diagnosis Date  . Hypertension   . Stroke [facility])      Past Surgical History:  Procedure Laterality Date  . HIP ARTHROPLASTY Right       Social History:  reports that he has been smoking Cigarettes.  He has been smoking about 1.00 pack per day. He has never used smokeless tobacco. He reports that he drinks about 1.8 - 2.4 oz of alcohol per week . He reports that he does not use drugs.    No Known Allergies      FAMILY HISTORY  When questioned  Directly-patient reports  No family history of HTN, CVA ,DIABETES, TB, Cancer CAD, Bleeding Disorders, Sickle Cell, diabetes, anemia, asthma,   Prior to Admission medications   Medication Sig Start Date End Date Taking? Authorizing Provider  amLODipine (NORVASC) 5 MG tablet Take 1 tablet (5 mg total) by mouth daily. Patient not taking: Reported on 03/13/2017 02/22/17   Liberty Handy, PA-C     Physical Exam: Vitals:   03/13/17 0532 03/13/17 0730 03/13/17 0736 03/13/17 1050  BP:  (!) 191/85 (!) 184/96 (!) 157/83  Pulse:  76 72 78  Resp:   17 15  Temp:      TempSrc:      SpO2:  98% 100% 100%  Weight: 68 kg (150 lb)     Height:  (1.88 m)           Vitals:   03/13/17 0532 03/13/17 0730 03/13/17 0736 03/13/17 1050  BP:  (!) 191/85 (!) 184/96 (!) 157/83  Pulse:  76 72 78  Resp:   17 15  Temp:      TempSrc:      SpO2:  98% 100% 100%  Weight: 68 kg (150 lb)     Height:  (1.88 m)      Chronically ill-appearing, very thin. Fingernails and toenails are very long and dirty. Unkempt appearance.  HENT:  Head: Normocephalic and atraumatic.  Eyes: Pupils are equal, round, and reactive to light. Conjunctivae and EOM are normal. Right eye exhibits no discharge. Left eye exhibits no discharge.  Neck: Normal range of  motion. Neck supple. No tracheal deviation present.  Cardiovascular: Normal rate, regular rhythm and normal heart sounds.   Diminished DP/PT pulses equally and bilaterally,  Homans sign absent bilaterally  Pulmonary/Chest: Effort normal. He has wheezes. He exhibits no tenderness.  No increased work of breathing, scattered wheezes and rhonchi diffusely, has barrel chest  Abdominal: Soft. Bowel sounds are normal. He exhibits no distension. There is no tenderness.  Musculoskeletal:  Right hip with decreased range of motion in all directions. 4+/5 strength of right hip flexor and lateral hip motion against resistant. 5/5 strength of the BLE major muscle groups otherwise including bl EHL. Keeps R hip and knee flexed, unable to straighten leg so unable to assess leg length discrepancy. TTP along the lateral right thigh without deformity, crepitus, or swelling. No midline spine TTP, no paraspinal muscle tenderness, no deformity, crepitus, or step-off noted. No SIJ tenderness.   Neurological: He is alert. A sensory deficit is present.  Alert and oriented to person only. Has some difficulty answering questions regarding time. Fluent speech, no facial droop, decreased sensation of the right lower extremity on soft touch as compared to the left lower extremity. He says this has been chronic for several years. Refuses to ambulate due to pain    Labs on Admission: I have personally reviewed following labs and imaging studies  CBC:  Recent Labs Lab 03/13/17 0851  WBC 10.3  NEUTROABS 8.4*  HGB 10.3*  HCT 34.5*  MCV 79.7  PLT 331    Basic Metabolic Panel:  Recent Labs Lab 03/13/17 0851  NA 139  K 3.1*  CL 104  CO2 27  GLUCOSE 121*  BUN 14  CREATININE 1.21  CALCIUM 9.1    GFR: Estimated Creatinine Clearance: 55.4 mL/min (by C-G formula based on SCr of 1.21 mg/dL).  Liver Function Tests:  Recent Labs Lab 03/13/17 0851  AST 20  ALT 19  ALKPHOS 120  BILITOT 0.8  PROT 8.3*  ALBUMIN  4.1   No results for input(s): LIPASE, AMYLASE in the last 168 hours. No results for input(s): AMMONIA in the last 168 hours.  Coagulation Profile: No results for input(s): INR, PROTIME in the last 168 hours. No results for input(s): DDIMER in the last 72 hours.  Cardiac Enzymes: No results for input(s): CKTOTAL, CKMB, CKMBINDEX, TROPONINI in the last 168 hours.  BNP (last 3 results) No results for input(s): PROBNP in the last 8760 hours.  HbA1C: No results for input(s): HGBA1C in the last 72 hours. No results found for: HGBA1C   CBG: No results for input(s): GLUCAP in the last 168 hours.  Lipid Profile: No results for input(s): CHOL, HDL, LDLCALC, TRIG, CHOLHDL, LDLDIRECT in the last 72 hours.  Thyroid Function Tests: No results for input(s): TSH, T4TOTAL, FREET4, T3FREE, THYROIDAB in the last 72 hours.  Anemia Panel: No results for input(s): VITAMINB12, FOLATE, FERRITIN, TIBC, IRON, RETICCTPCT in the last 72 hours.  Urine analysis:    Component Value Date/Time  COLORURINE YELLOW 02/21/2017 1822   APPEARANCEUR HAZY (A) 02/21/2017 1822   LABSPEC 1.010 02/21/2017 1822   PHURINE 7.0 02/21/2017 1822   GLUCOSEU NEGATIVE 02/21/2017 1822   HGBUR SMALL (A) 02/21/2017 1822   BILIRUBINUR NEGATIVE 02/21/2017 1822   KETONESUR NEGATIVE 02/21/2017 1822   PROTEINUR 30 (A) 02/21/2017 1822   NITRITE NEGATIVE 02/21/2017 1822   LEUKOCYTESUR MODERATE (A) 02/21/2017 1822    Sepsis Labs: (procalcitonin:4,lacticidven:4) )No results found for this or any previous visit (from the past 240 hour(s)).       Radiological Exams on Admission: Dg Foot Complete Right  Result Date: 03/13/2017 CLINICAL DATA:  Swollen inflamed appearing right foot for the past 2 days. History of previous CVA, current smoker. EXAM: RIGHT FOOT COMPLETE - 3+ VIEW COMPARISON:  None in PACs FINDINGS: The bones are osteopenic. The phalanges and metatarsals appear intact. The tarsal bones also appear  intact. The patient has undergone previous ORIF for medial malleolar and distal fibular fractures. There are no plain radiographic findings to suggest osteomyelitis. There is diffuse soft tissue swelling. IMPRESSION: No definite acute fracture or dislocation is observed. There is diffuse soft tissue swelling without radiographic evidence of osteomyelitis. Electronically Signed   By: David  Sw[place]and M.D.   On: 03/13/2017 08:35   Ct Head Wo Contrast  Result Date: 02/21/2017 CLINICAL DATA:  Unable to ambulate. Hypertension. Poor p.o. intake. EXAM: CT HEAD WITHOUT CONTRAST TECHNIQUE: Contiguous axial images were obtained from the base of the skull through the vertex without intravenous contrast. COMPARISON:  None. FINDINGS: Brain: There is mild generalized age-related parenchymal atrophy with commensurate dilatation of the ventricles and sulci. Chronic small vessel ischemic changes noted within the bilateral periventricular and subcortical white matter regions. 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 There is no mass, hemorrhage, edema or other evidence of acute parenchymal abnormality. No extra-axial hemorrhage. Vascular: There are chronic calcified atherosclerotic changes of the large vessels at the skull base. No unexpected hyperdense vessel. Skull: Normal. Negative for fracture or focal lesion. Sinuses/Orbits: No acute finding. Other: None. IMPRESSION: 1. No acute findings.  No intracranial mass, hemorrhage or edema. 2. Chronic small vessel ischemic changes in the white matter and right thalamus. Electronically Signed   By: Bary Richard M.D.   On: 02/21/2017 14:57   Dg Foot Complete Right  Result Date: 03/13/2017 CLINICAL DATA:  Swollen inflamed appearing right foot for the past 2 days. History of previous CVA, current smoker. EXAM: RIGHT FOOT COMPLETE - 3+ VIEW COMPARISON:  None in PACs FINDINGS: The bones are osteopenic. The phalanges and metatarsals appear intact. The tarsal bones also appear  intact. The patient has undergone previous ORIF for medial malleolar and distal fibular fractures. There are no plain radiographic findings to suggest osteomyelitis. There is diffuse soft tissue swelling. IMPRESSION: No definite acute fracture or dislocation is observed. There is diffuse soft tissue swelling without radiographic evidence of osteomyelitis. Electronically Signed   By: David  [place]aziland M.D.   On: 03/13/2017 08:35   Dg Hip Unilat With Pelvis 2-3 Views Right  Result Date: 02/21/2017 CLINICAL DATA:  Right hip pain after fall several days ago. EXAM: DG HIP (WITH OR WITHOUT PELVIS) 2-3V RIGHT COMPARISON:  None. FINDINGS: Status post surgical internal fixation of old proximal femoral neck fracture. Severe degenerative joint disease is seen involving the right hip joint. Vascular calcifications are noted. No evidence of acute fracture or dislocation is seen involving the right femur. IMPRESSION: Status post surgical internal fixation of old proximal femoral neck  fracture. No acute right femur fracture is noted. 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 Electronically Signed   By: Lupita Raider, M.D.   On: 02/21/2017 15:10   Dg Femur 1v Right  Result Date: 02/21/2017 CLINICAL DATA:  Right hip pain after fall several days ago. EXAM: RIGHT FEMUR 1 VIEW COMPARISON:  None. FINDINGS: Severe degenerative joint disease of the right hip joint is noted. Status post surgical internal fixation of old proximal right femoral neck fracture. No acute fracture or dislocation is noted. IMPRESSION: Severe degenerative joint disease of the right hip. Status post surgical internal fixation of old proximal right femoral neck fracture. No acute abnormality is noted. Electronically Signed   By: Lupita Raider, M.D.   On: 02/21/2017 15:08      EKG: Independently reviewed. 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 Currently lives in a homeless shelter  Will order social work consult PT/OT/speech  Recent UTI, UA pending      DVT prophylaxis:  Lovenox     Code Status Orders FULL CODE        consults called:  Family Communication: Admission, patients condition and plan of care including tests being ordered have been discussed with the patient  who indicates understanding and agree with the plan and Code Status  Admission status: inpatient    Disposition plan: Further plan will depend as patient's clinical course evolves and further radiologic and laboratory data become available. Likely home when stable   At the time of admission, it appears that the appropriate admission status for this patient is INPATIENT .Thisis judged to be reasonable and necessary in order to provide the required intensity of service to ensure the patient's safetygiven thepresenting symptoms, physical exam findings, and initial radiographic and laboratory data in the context of their chronic comorbidities.   Richarda Overlie MD [facility] Pager [phone]  If 7PM-7AM, please contact night-coverage [email or web address] Password [facility]  03/13/2017, 11:38 AM

## 2017-03-13 NOTE — Progress Notes (Addendum)
CSW noted pt's return to the ED.  Pt is known to the CSW from a previous ED stay on 02/21/17.  Pt stated he was from [place], [place] and en route to [place], [place]" on previous admission to the ED, per this CSW's previous note on 02/21/17:   ,"CSW met with CM who was familiar with pt's situation via the notes and CSW and CM met with pt.  Pt states he is from [place] and rented a room from a:  [facility] [place]  Pt reported to [place] and CM pt was en route by [place] train to [place] and that the [place] train took a circuitous route from [place], through [place] up to [employer]. [place] and then was to route back through to the pt's destination in [place].  Pt stated he urinated on himself en route, did not have a change of clothes and that [employer] ejected pt from the train in [place].  Pt stated he was en route to his sister's house in [place] :  [address] [place] [address]  Pt states he has Medicare and Medicaid (in [place]) and registration is verifying now.  Pt states he PCP is a Dr. Glendora Score in [place], [place].  CM witnessed CSW request verbal permission fro pt to contact pt's sister and roommate, pt gave verbal permission.  Pt stated to CSW and CM he had recently called his sister using her number and was unable to reach her and now states he does not have the number for his ex-roommate or his sister.  CSW staffed case with CSW Asst Director who verified pt is alert and oriented X 4 and as such it is the pt's prerogative to track down his family if pt wishes and that since pt presents as being of sound mind pt can supply the phone numbers for CSW to contact family or pt can contact family himself.  Per CSW Asst Director once pt is medically cleared should be given a shelter list and a bus pass and D/C 'd.  Please reconsult if future social work needs arise.  CSW signing off, as social work intervention is no  longer needed.  8:36 PM With the verbal permission of the pt provided to the CSW the pt was assessed by Kendra Opitz of the [employer] [facility] to [place]:  9/27: CSW called [employer] to verify pt's story and [employer] rep stated there is no train going to [place] that the only two [place] stops are in [place], [place] and [place], [place] and [place] notes by viewing maps both locations are a great distance from [place], [place] and pt arrived at the ED with no money, no phone, and no contact numbers to contact pt's sister should he had arrived in [place] and thus, would have had no way to travel to [place], [place] from [place], [place], or [place], the only two stops for [employer] riders in [place].    Further [employer] stated if a pt was ejected from a train for violating rules and/or decorum, "specifically if pt was "kicked off the train" for urinating on himself",  there is no option available for reimbursement of the value of the ticket price and no option available for a replacement ticket.    On 9/7, per notes CSW Asst Director stated to this CSW, "[place] staffed case with CSW Asst Director who verified pt is alert and oriented X 4 and as such it is the pt's prerogative to  track down his family if pt wishes and that since pt presents as being of sound mind pt can supply the phone numbers for CSW to contact family or pt can contact family himself.  Per CSW Asst Director once pt is medically cleared should be given a shelter list and a bus pass and D/C 'd."  On 9/7: CSW provided pt with a bus pass and shelter list and also found pt an option to stay with the [facility] [employer] where pt was residing previous to pt's current stay in the ED, CSW is unsure if pt is now there or has left the home since. On 9/8: pt was D/C'd from the ED and left to reside with Kendra Opitz at her facility.  Please reconsult if future social work needs arise.  CSW signing off, as social work  intervention is no longer needed.  Alphonse Guild. Liyanna Cartwright, LCSW, LCAS, CSI Clinical Social Worker Ph: [phone]

## 2017-03-13 NOTE — Progress Notes (Signed)
Pharmacy Antibiotic Note  Robert Romero is a 71 y.o. male admitted on 03/13/2017 with cellulitis of the right foot/ankle.  He has implanted hardware from previous ORIF (unknown date).  Pharmacy has been consulted for vancomycin dosing.  SCr 1.2, CrCl ~ 55 ml/min Afebrile WBC 10.3  Plan: Vancomycin 1g IV x1 dose in ED, give additional  IV now, then maintenance dose of 1250 mg IV q24h. Measure Vanc trough at steady state.  Goal VT 10-15 mcg/mL Follow up renal fxn, culture results, and clinical course.    Height:  (188 cm) Weight: 150 lb (68 kg) IBW/kg (Calculated) : 82.2  Temp (24hrs), Avg:98 F (36.7 C), Min:98 F (36.7 C), Max:98 F (36.7 C)   Recent Labs Lab 03/13/17 0851  WBC 10.3  CREATININE 1.21    Estimated Creatinine Clearance: 55.4 mL/min (by C-G formula based on SCr of 1.21 mg/dL).    No Known Allergies  Antimicrobials this admission: 9/28 Vancomycin >>  Dose adjustments this admission:   Microbiology results: none  Thank you for allowing pharmacy to be a part of this patient's care.  Lynann Beaver PharmD, BCPS Pager [phone] 03/13/2017 12:15 PM

## 2017-03-13 NOTE — ED Notes (Signed)
Attempt to call report x 1  

## 2017-03-14 DIAGNOSIS — N390 Urinary tract infection, site not specified: Secondary | ICD-10-CM

## 2017-03-14 DIAGNOSIS — D649 Anemia, unspecified: Secondary | ICD-10-CM

## 2017-03-14 LAB — FERRITIN: FERRITIN: 27 ng/mL (ref 24–336)

## 2017-03-14 LAB — CBC
HEMATOCRIT: 26.5 % — AB (ref 39.0–52.0)
Hemoglobin: 8 g/dL — ABNORMAL LOW (ref 13.0–17.0)
MCH: 24.5 pg — AB (ref 26.0–34.0)
MCHC: 30.2 g/dL (ref 30.0–36.0)
MCV: 81.3 fL (ref 78.0–100.0)
PLATELETS: 232 10*3/uL (ref 150–400)
RBC: 3.26 MIL/uL — ABNORMAL LOW (ref 4.22–5.81)
RDW: 16.7 % — AB (ref 11.5–15.5)
WBC: 6.1 10*3/uL (ref 4.0–10.5)

## 2017-03-14 LAB — IRON AND TIBC
IRON: 17 ug/dL — AB (ref 45–182)
Saturation Ratios: 4 % — ABNORMAL LOW (ref 17.9–39.5)
TIBC: 388 ug/dL (ref 250–450)
UIBC: 371 ug/dL

## 2017-03-14 LAB — COMPREHENSIVE METABOLIC PANEL
ALBUMIN: 2.8 g/dL — AB (ref 3.5–5.0)
ALT: 14 U/L — AB (ref 17–63)
AST: 17 U/L (ref 15–41)
Alkaline Phosphatase: 82 U/L (ref 38–126)
Anion gap: 6 (ref 5–15)
BUN: 14 mg/dL (ref 6–20)
CHLORIDE: 109 mmol/L (ref 101–111)
CO2: 25 mmol/L (ref 22–32)
CREATININE: 1.2 mg/dL (ref 0.61–1.24)
Calcium: 8.3 mg/dL — ABNORMAL LOW (ref 8.9–10.3)
GFR calc Af Amer: >60 mL/min (ref >60)
GFR, EST NON AFRICAN AMERICAN: 60 mL/min — AB (ref >60)
Glucose, Bld: 85 mg/dL (ref 65–99)
POTASSIUM: 3.2 mmol/L — AB (ref 3.5–5.1)
SODIUM: 140 mmol/L (ref 135–145)
Total Bilirubin: 0.4 mg/dL (ref 0.3–1.2)
Total Protein: 6 g/dL — ABNORMAL LOW (ref 6.5–8.1)

## 2017-03-14 LAB — TROPONIN I: TROPONIN I: 0.03 ng/mL — AB (ref <0.03)

## 2017-03-14 LAB — RETICULOCYTES
RBC.: 3.41 MIL/uL — AB (ref 4.22–5.81)
RETIC COUNT ABSOLUTE: 51.2 10*3/uL (ref 19.0–186.0)
Retic Ct Pct: 1.5 % (ref 0.4–3.1)

## 2017-03-14 LAB — VITAMIN B12: Vitamin B-12: 340 pg/mL (ref 180–914)

## 2017-03-14 LAB — FOLATE: Folate: 15.4 ng/mL (ref >5.9)

## 2017-03-14 MED ORDER — HYDRALAZINE HCL 20 MG/ML IJ SOLN
5.0000 mg | Freq: Once | INTRAMUSCULAR | Status: AC
  Administered 2017-03-14: 5 mg via INTRAVENOUS
  Filled 2017-03-14: qty 1

## 2017-03-14 MED ORDER — POTASSIUM CHLORIDE CRYS ER 20 MEQ PO TBCR
40.0000 meq | EXTENDED_RELEASE_TABLET | ORAL | Status: AC
  Administered 2017-03-14 (×2): 40 meq via ORAL
  Filled 2017-03-14 (×2): qty 2

## 2017-03-14 MED ORDER — HYDRALAZINE HCL 20 MG/ML IJ SOLN: 10.0000 mg | Freq: Four times a day (QID) | INTRAMUSCULAR | Status: DC | PRN

## 2017-03-14 MED ORDER — TRAMADOL HCL 50 MG PO TABS
50.0000 mg | ORAL_TABLET | Freq: Four times a day (QID) | ORAL | Status: DC | PRN
  Administered 2017-03-14 – 2017-03-16 (×7): 50 mg via ORAL
  Filled 2017-03-14 (×7): qty 1

## 2017-03-14 MED ORDER — MAGNESIUM SULFATE 2 GM/50ML IV SOLN
2.0000 g | Freq: Once | INTRAVENOUS | Status: AC
  Administered 2017-03-14: 2 g via INTRAVENOUS
  Filled 2017-03-14: qty 50

## 2017-03-14 NOTE — Progress Notes (Signed)
PT Cancellation Note  Patient Details Name: Wassim Kirksey MRN: [id number] DOB: 08-09-1945   Cancelled Treatment:    Reason Eval/Treat Not Completed: Pain limiting ability to participate, declined to attempt transfers.    Sharen Heck PT [phone]  03/14/2017, 3:30 PM

## 2017-03-14 NOTE — Progress Notes (Addendum)
OT Cancellation Note  Patient Details Name: Robert Romero MRN: [id number] DOB: 1945/10/08   Cancelled Treatment:    Reason Eval/Treat Not Completed: Patient declined, no reason specified; Pt declining OT tx; Educated Pt on benefits of OOB/mobility. Will follow up as schedule permits.  Marcy Siren, OT Pager [phone] 03/14/2017   Orlando Penner 03/14/2017, 3:29 PM

## 2017-03-14 NOTE — Care Management Note (Signed)
Case Management Note  Patient Details  Name: Savion Washam MRN: [id number] Date of Birth: [birth year]  Subjective/Objective:                   71 year old male with history of hypertension, untreated, who presented to ED for worsening acute on chronic right hip pain, right ankle pain from a fall 14 days ago. Recently in the ED on 9/8, x-ray did not show any acute abnormalities except for Klebsiella UTI. Patient was discharged on Keflex. Patient is not sure if he completed his Keflex regimen. Patient was begun from the homeless shelter  via [place]. Presented with right foot is swollen, red, inflamed for the last two days.  ED course Patient hypotensive with systolic blood pressure as high as 191, afebrile, X-rays do not show any evidence of acute fracture or osteomyelitis Potassium 3.1, hemoglobin 10.3 Patient is being admitted for possible right ankle cellulitis, hypokalemia , dehydration  Action/Plan: Date:  March 14, 2017 Chart reviewed for concurrent status and case management needs.  Will continue to follow patient progress.  Discharge Planning: following for needs  Expected discharge date: March 17, 2017  Marcelle Smiling, BSN, [place], [place]   [phone]   Expected Discharge Date:   (UNKNOWN)               Expected Discharge Plan:  Homeless Shelter  In-House Referral:  Clinical Social Work  Discharge planning Services  CM Consult  Post Acute Care Choice:    Choice offered to:     DME Arranged:    DME Agency:     HH Arranged:    HH Agency:     Status of Service:  In process, will continue to follow  If discussed at Long Length of Stay Meetings, dates discussed:    Additional Comments:  Golda Acre, RN 03/14/2017, 9:29 AM

## 2017-03-14 NOTE — Progress Notes (Signed)
PROGRESS NOTE    Flora Ratz [facility]  ONG:[id number] DOB: 03-Jan-1946 DOA: 03/13/2017 PCP: Patient, No Pcp Per    Brief Narrative:  71 y/o male with history of HTN admitted to the hospital with right foot cellulitis. Recently was prescribed a course of keflex for UTI, but unclear if he took this. Started on vancomycin. Urine looks like it might be infected, urine culture has been sent. Clinically improving.   Assessment & Plan:   Principal Problem:   Right foot infection Active Problems:   Essential hypertension   Cellulitis of right foot   Anemia   UTI (urinary tract infection)   1. Right foot cellulitis. Failed outpatient antibiotic therapy. Started on vancomycin. Plain films of right foot do not raise concerns for underlying osteomyelitis. He does not have any purulent drainage. Clinically he feels that he is improving. Continue with current treatments 2. HTN. Blood pressure is stable today. Continue on norvasc 3. UTI. Urinalysis indicates possible UTI. Will check urine culture 4. Anemia. No evidence of bleeding. Suspect chronic disease. Will check anemia panel. Recheck labs in AM.   DVT prophylaxis: lovenox Code Status: full code Family Communication: no family present. Discussed with patient Disposition Plan: discharge home once improve. Will likely need csw assistance since his homeless   Consultants:     Procedures:     Antimicrobials:   Vancomycin 9/27>>    Subjective: Feels that swelling and pain in foot has gotten better since admission  Objective: Vitals:   03/13/17 2108 03/14/17 0532 03/14/17 0959 03/14/17 1511  BP: (!) 151/69 (!) 187/91 (!) 142/75 (!) 153/75  Pulse: 85 (!) 59 65 64  Resp: Temp: 98.6 F (37 C) 98 F (36.7 C)  97.9 F (36.6 C)  TempSrc: Oral Oral  Oral  SpO2: 98% 100%  99%  Weight:      Height:        Intake/Output Summary (Last 24 hours) at 03/14/17 1615 Last data filed at 03/14/17 1511  Gross per 24 hour    Intake           2467.5 ml  Output             1200 ml  Net           1267.5 ml   Filed Weights   03/13/17 0532  Weight: 68 kg (150 lb)    Examination:  General exam: Appears calm and comfortable  Respiratory system: Clear to auscultation. Respiratory effort normal. Cardiovascular system: S1 & S2 heard, RRR. No JVD, murmurs, rubs, gallops or clicks.  Gastrointestinal system: Abdomen is nondistended, soft and nontender. No organomegaly or masses felt. Normal bowel sounds heard. Central nervous system: Alert and oriented. No focal neurological deficits. Extremities: Symmetric 5 x 5 power. Skin: right foot is warm, erythematous and edematous. Erythema does not extend above ankle Psychiatry: Judgement and insight appear normal. Mood & affect appropriate.     Data Reviewed: I have personally reviewed following labs and imaging studies  CBC:  Recent Labs Lab 03/13/17 0851 03/14/17 0550  WBC 10.3 6.1  NEUTROABS 8.4*  --   HGB 10.3* 8.0*  HCT 34.5* 26.5*  MCV 79.7 81.3  PLT 331 232   Basic Metabolic Panel:  Recent Labs Lab 03/13/17 0851 03/13/17 1557 03/14/17 0550  NA 139  --  140  K 3.1*  --  3.2*  CL 104  --  109  CO2 27  --  25  GLUCOSE 121*  --  85  BUN 14  --  14  CREATININE 1.21  --  1.20  CALCIUM 9.1  --  8.3*  MG  --  1.7  --    GFR: Estimated Creatinine Clearance: 55.9 mL/min (by C-G formula based on SCr of 1.2 mg/dL). Liver Function Tests:  Recent Labs Lab 03/13/17 0851 03/14/17 0550  AST 20 17  ALT 19 14*  ALKPHOS 120 82  BILITOT 0.8 0.4  PROT 8.3* 6.0*  ALBUMIN 4.1 2.8*   No results for input(s): LIPASE, AMYLASE in the last 168 hours. No results for input(s): AMMONIA in the last 168 hours. Coagulation Profile: No results for input(s): INR, PROTIME in the last 168 hours. Cardiac Enzymes:  Recent Labs Lab 03/13/17 1557 03/13/17 2334  TROPONINI 0.03* 0.03*   BNP (last 3 results) No results for input(s): PROBNP in the last 8760  hours. HbA1C: No results for input(s): HGBA1C in the last 72 hours. CBG: No results for input(s): GLUCAP in the last 168 hours. Lipid Profile: No results for input(s): CHOL, HDL, LDLCALC, TRIG, CHOLHDL, LDLDIRECT in the last 72 hours. Thyroid Function Tests:  Recent Labs  03/13/17 1557  TSH 1.603   Anemia Panel: No results for input(s): VITAMINB12, FOLATE, FERRITIN, TIBC, IRON, RETICCTPCT in the last 72 hours. Sepsis Labs: No results for input(s): PROCALCITON, LATICACIDVEN in the last 168 hours.  No results found for this or any previous visit (from the past 240 hour(s)).       Radiology Studies: Dg Foot Complete Right  Result Date: 03/13/2017 CLINICAL DATA:  Swollen inflamed appearing right foot for the past 2 days. History of previous CVA, current smoker. EXAM: RIGHT FOOT COMPLETE - 3+ VIEW COMPARISON:  None in PACs FINDINGS: The bones are osteopenic. The phalanges and metatarsals appear intact. The tarsal bones also appear intact. The patient has undergone previous ORIF for medial malleolar and distal fibular fractures. There are no plain radiographic findings to suggest osteomyelitis. There is diffuse soft tissue swelling. IMPRESSION: No definite acute fracture or dislocation is observed. There is diffuse soft tissue swelling without radiographic evidence of osteomyelitis. Electronically Signed   By: David  [place] M.D.   On: 03/13/2017 08:35        Scheduled Meds: . amLODipine  10 mg Oral Daily  . enoxaparin (LOVENOX) injection  40 mg Subcutaneous Q24H  . potassium chloride  40 mEq Oral Q3H   Continuous Infusions: . sodium chloride 75 mL/hr at 03/13/17 2219  . sodium chloride 75 mL/hr at 03/13/17 1151  . vancomycin 1,250 mg (03/14/17 1420)     LOS: 1 day    Time spent:    Sherronda Sweigert, MD [facility] Pager [phone]  If 7PM-7AM, please contact night-coverage [email or web address] Password [facility] 03/14/2017, 4:15 PM

## 2017-03-14 NOTE — Evaluation (Addendum)
Clinical/Bedside Swallow Evaluation Patient Details  Name: Robert Romero MRN: [id number] Date of Birth: [birth year]  Today's Date: 03/14/2017 Time: SLP Start Time (ACUTE ONLY): 1105 SLP Stop Time (ACUTE ONLY): 1123 SLP Time Calculation (min) (ACUTE ONLY): 18 min  Past Medical History:  Past Medical History:  Diagnosis Date  . Hypertension   . Stroke [facility])    Past Surgical History:  Past Surgical History:  Procedure Laterality Date  . HIP ARTHROPLASTY Right    HPI:  71 yo male who has been at homeless shelter for 2 nights admit to [facility] with right foot infection.  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  Pt states he does not walk prior to admit and uses a wheelchair.  Swallow eval ordered.  Pt admits to occasional issues with swallowing - most notably with solids lodging in his throat. He denies recent weight loss, pulmonary infections nor requiring heimlich maneuver.     Assessment / Plan / Recommendation Clinical Impression  Functional oropharyngeal swallow ability apparent clinically.  Pt does admit to premorbid occasional difficulties with solids - sensing them lodging in the back of the throat.  No pnas recently, unintended weight loss nor heimlich manuevers required per pt report. NO indications of deficits during clinical observation.  He does demonstrates palatal deviation to left indicative of right weakness and right facial asymmetry.  SLP educated pt to compensations for his occasional dysphagia symptoms.  Recommend continue diet and SLP will sign off - no follow up.  Thanks.  SLP Visit Diagnosis: Dysphagia, unspecified (R13.10)    Aspiration Risk  Mild aspiration risk    Diet Recommendation Regular;Thin liquid   Liquid Administration via: Cup;Straw Medication Administration: Whole meds with liquid Supervision: Patient able to self feed Compensations: Slow rate;Small sips/bites Postural Changes: Seated upright at 90 degrees    Other   Recommendations Oral Care Recommendations: Oral care BID   Follow up Recommendations None      Frequency and Duration            Prognosis        Swallow Study   General Date of Onset: 03/14/17 HPI: 71 yo male who has been at homeless shelter for 2 nights admit to [facility] with right foot infection.  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  Pt states he does not walk prior to admit and uses a wheelchair.  Swallow eval ordered.  Pt admits to occasional issues with swallowing - most notably with solids lodging in his throat. He denies recent weight loss, pulmonary infections nor requiring heimlich maneuver.   Type of Study: Bedside Swallow Evaluation Diet Prior to this Study: Regular;Thin liquids Temperature Spikes Noted: No Respiratory Status: Room air History of Recent Intubation: No Behavior/Cognition: Alert;Cooperative Oral Cavity Assessment: Within Functional Limits Oral Care Completed by SLP: No Oral Cavity - Dentition: Missing dentition;Adequate natural dentition (missing front teeth - which pt reports have been gone for a long time) Vision: Functional for self-feeding Self-Feeding Abilities: Able to feed self Patient Positioning: Upright in bed Baseline Vocal Quality: Normal Volitional Cough: Strong Volitional Swallow: Able to elicit    Oral/Motor/Sensory Function Overall Oral Motor/Sensory Function: Other (comment) (palatal deviation to left upon phonation indicating right weak, facial asymmetry right)   Ice Chips Ice chips: Not tested   Thin Liquid Thin Liquid: Within functional limits Presentation: Self Fed;Straw    Nectar Thick Nectar Thick Liquid: Not tested   Honey  Thick Honey Thick Liquid: Not tested   Puree Puree: Within functional limits Presentation: Self Fed;Spoon   Solid   GO   Solid: Within functional limits Presentation: Self Orvan July 03/14/2017,11:47 AM Donavan Burnet, MS [facility]  SLP [phone]  Phoned [facility] on pt behalf and had hiim speak to front desk people at [facility] regarding his wheelchair.  Left phone numbers for pt - and informed nurses of pt needing to call after 4 pm to see if wheelchair is still at [facility] per pt phone conversation with [name] at [employer].  Thanks.

## 2017-03-15 DIAGNOSIS — L089 Local infection of the skin and subcutaneous tissue, unspecified: Secondary | ICD-10-CM

## 2017-03-15 LAB — CBC
HCT: 26.9 % — ABNORMAL LOW (ref 39.0–52.0)
HEMOGLOBIN: 8 g/dL — AB (ref 13.0–17.0)
MCH: 24 pg — ABNORMAL LOW (ref 26.0–34.0)
MCHC: 29.7 g/dL — AB (ref 30.0–36.0)
MCV: 80.5 fL (ref 78.0–100.0)
Platelets: 233 10*3/uL (ref 150–400)
RBC: 3.34 MIL/uL — ABNORMAL LOW (ref 4.22–5.81)
RDW: 16.7 % — AB (ref 11.5–15.5)
WBC: 4.4 10*3/uL (ref 4.0–10.5)

## 2017-03-15 LAB — BASIC METABOLIC PANEL
Anion gap: 5 (ref 5–15)
BUN: 15 mg/dL (ref 6–20)
CALCIUM: 8.4 mg/dL — AB (ref 8.9–10.3)
CO2: 28 mmol/L (ref 22–32)
CREATININE: 1.26 mg/dL — AB (ref 0.61–1.24)
Chloride: 107 mmol/L (ref 101–111)
GFR calc non Af Amer: 57 mL/min — ABNORMAL LOW (ref >60)
Glucose, Bld: 92 mg/dL (ref 65–99)
Potassium: 4.1 mmol/L (ref 3.5–5.1)
SODIUM: 140 mmol/L (ref 135–145)

## 2017-03-15 MED ORDER — CEFAZOLIN SODIUM-DEXTROSE 1-4 GM/50ML-% IV SOLN
1.0000 g | Freq: Three times a day (TID) | INTRAVENOUS | Status: DC
  Administered 2017-03-15 – 2017-03-17 (×5): 1 g via INTRAVENOUS
  Filled 2017-03-15 (×6): qty 50

## 2017-03-15 NOTE — Progress Notes (Signed)
PROGRESS NOTE  Zi [facility]  ZOX:[id number] DOB: 01/31/1946 DOA: 03/13/2017   PCP: Patient, No Pcp Per   Brief Narrative:  71 y/o male with history of HTN admitted to the hospital with right foot cellulitis. Recently was prescribed a course of keflex for UTI, but unclear if he took this. Started on vancomycin. Urine looks like it might be infected, urine culture has been sent. Clinically improving.  Assessment & Plan:  1. Right foot cellulitis. Failed outpatient antibiotic therapy. Started on vancomycin. Plain films of right foot do not raise concerns for underlying osteomyelitis. Overall improving, narrow down ABX and possible transition to PO in AM 2. HTN. Blood pressure is stable, keep on Norvasc 3. UTI. Urinalysis indicates possible UTI. Urine culture pending  4. Anemia of chronic disease, no evidence of active bleeding  5. Hypokalemia - supplemented and WNL 6. Acute kidney injury - encourage oral intake, stop vanc and change to Ancef today, BMP in AM  DVT prophylaxis: lovenox Code Status: full code Family Communication: pt at bedside  Disposition Plan: plan d/c in 1-2 days, ? SNF   Consultants:   None  Procedures:   None  Antimicrobials:   Vancomycin 9/27 --> 9/29  Ancef 9/29 -->  Subjective: Pt reports feeling better.   Objective: Vitals:   03/14/17 1511 03/14/17 2237 03/15/17 0531 03/15/17 1342  BP: (!) 153/75 (!) 152/82 (!) 162/87 (!) 148/67  Pulse: 64 62 69 61  Resp: Temp: 97.9 F (36.6 C) 97.7 F (36.5 C) 98.2 F (36.8 C) 98.1 F (36.7 C)  TempSrc: Oral Oral Oral Oral  SpO2: 99% 98% 97% 100%  Weight:      Height:        Intake/Output Summary (Last 24 hours) at 03/15/17 1452 Last data filed at 03/15/17 1343  Gross per 24 hour  Intake             1560 ml  Output             2985 ml  Net            -1425 ml   Filed Weights   03/13/17 0532  Weight: 68 kg (150 lb)   Physical Exam  Constitutional: Appears calm, NAD CVS: RRR,  S1/S2 +, no murmurs, no gallops, no carotid bruit.  Pulmonary: Effort and breath sounds normal, no stridor, rhonchi, wheezes, rales.  Abdominal: Soft. BS +,  no distension, tenderness, rebound or guarding.  Musculoskeletal: Normal range of motion. Right foot still slightly swollen and erythematous, warm and mild TTP   Data Reviewed: I have personally reviewed following labs and imaging studies  CBC:  Recent Labs Lab 03/13/17 0851 03/14/17 0550 03/15/17 0522  WBC 10.3 6.1 4.4  NEUTROABS 8.4*  --   --   HGB 10.3* 8.0* 8.0*  HCT 34.5* 26.5* 26.9*  MCV 79.7 81.3 80.5  PLT 331 232 233   Basic Metabolic Panel:  Recent Labs Lab 03/13/17 0851 03/13/17 1557 03/14/17 0550 03/15/17 0522  NA 139  --  140 140  K 3.1*  --  3.2* 4.1  CL 104  --  109 107  CO2 27  --  25 28  GLUCOSE 121*  --  85 92  BUN 14  --  14 15  CREATININE 1.21  --  1.20 1.26*  CALCIUM 9.1  --  8.3* 8.4*  MG  --  1.7  --   --    Liver Function Tests:  Recent Labs  Lab 03/13/17 0851 03/14/17 0550  AST 20 17  ALT 19 14*  ALKPHOS 120 82  BILITOT 0.8 0.4  PROT 8.3* 6.0*  ALBUMIN 4.1 2.8*   Cardiac Enzymes:  Recent Labs Lab 03/13/17 1557 03/13/17 2334  TROPONINI 0.03* 0.03*   Thyroid Function Tests:  Recent Labs  03/13/17 1557  TSH 1.603   Anemia Panel:  Recent Labs  03/14/17 1654  VITAMINB12 340  FOLATE 15.4  FERRITIN 27  TIBC 388  IRON 17*  RETICCTPCT 1.5   Radiology Studies: No results found.  Scheduled Meds: . amLODipine  10 mg Oral Daily  . enoxaparin (LOVENOX) injection  40 mg Subcutaneous Q24H   Continuous Infusions: . sodium chloride 75 mL/hr at 03/13/17 2219  . sodium chloride 75 mL/hr at 03/13/17 1151  .  ceFAZolin (ANCEF) IV       LOS: 2 days   Time spent: 25 minutes   Debbora Presto, MD [facility] Pager [phone]  If 7PM-7AM, please contact night-coverage [email or web address] Password [email or web address] 03/15/2017, 2:52 PM

## 2017-03-15 NOTE — Evaluation (Addendum)
Physical Therapy Evaluation Patient Details Name: Robert Romero MRN: [id number] DOB: 1945/09/03 Today's Date: 03/15/2017   History of Present Illness  71 year old homeless male with history of 2 R hip fractures, CVA with R HP, hypertension, untreated, who presented to ED for worsening acute on chronic right hip pain, right ankle pain from a fall 14 days ago. Recently in the ED on 9/8, x-ray did not show any acute abnormalities except for Klebsiella UTI. Patient was discharged on Keflex. Patient is not sure if he completed his Keflex regimen. Presented with right foot is swollen, red, inflamed for the last two days.   Clinical Impression  Pt admitted with above diagnosis. Pt currently with functional limitations due to the deficits listed below (see PT Problem List). Min assist for bed to recliner transfer. Pt has been WC bound since a fall and R hip pain in early September. Prior to that he ambulated with a rollator.  Pt will benefit from skilled PT to increase their independence and safety with mobility to allow discharge to the venue listed below.       Follow Up Recommendations SNF (or ILF/ALF if able to assist with ADLs and transfers, pt was staying at homeless shelter prior to admission)    Equipment Recommendations  None recommended by PT    Recommendations for Other Services       Precautions / Restrictions Precautions Precautions: Fall Restrictions Weight Bearing Restrictions: No      Mobility  Bed Mobility Overal bed mobility: Needs Assistance Bed Mobility: Supine to Sit     Supine to sit: Modified independent (Device/Increase time);HOB elevated     General bed mobility comments: used UEs to self assist RLE to EOB  Transfers Overall transfer level: Needs assistance Equipment used: Rolling walker (2 wheeled) Transfers: Sit to/from [employer] Sit to Stand: From elevated surface;Min assist Stand pivot transfers: Min assist       General  transfer comment: min A to rise and pivot to recliner with RW, VCs hand placement, maintains RLE in NWB position 2* pain  Ambulation/Gait             General Gait Details: unable  Stairs            Wheelchair Mobility    Modified Rankin (Stroke Patients Only)       Balance Overall balance assessment: Needs assistance   Sitting balance-Leahy Scale: Good     Standing balance support: Bilateral upper extremity supported Standing balance-Leahy Scale: Poor Standing balance comment: requires BUE support                             Pertinent Vitals/Pain Pain Score: 5  Pain Location: RLE Pain Descriptors / Indicators: Sore Pain Intervention(s): Limited activity within patient's tolerance;Monitored during session;Repositioned    Home Living Family/patient expects to be discharged to:: Unsure                 Additional Comments: pt was staying at homeless shelter [facility]    Prior Function Level of Independence: Independent with assistive device(s)         Comments: walked with rollator prior to ~1 month ago, has been using WC since a fall in earlier this month, WC is at shelter     Hand Dominance        Extremity/Trunk Assessment   Upper Extremity Assessment RUE Deficits / Details: 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, shoulder elevation  AROM ~70*    Lower Extremity Assessment Lower Extremity Assessment: RLE deficits/detail RLE Deficits / Details: pt guards RLE 2* pain, not able to tolerate ROM/strength assessment, used hands to help advance RLE to edge of bed, keeps RLE in NWB position in standing,  reports decreased sensation to light touch R foot RLE: Unable to fully assess due to pain RLE Sensation: decreased light touch    Cervical / Trunk Assessment Cervical / Trunk Assessment: Normal  Communication   Communication: No difficulties  Cognition Arousal/Alertness: Awake/alert Behavior During Therapy: WFL for tasks  assessed/performed Overall Cognitive Status: Within Functional Limits for tasks assessed                                        General Comments      Exercises     Assessment/Plan    PT Assessment Patient needs continued PT services  PT Problem List Decreased strength;Decreased range of motion;Decreased activity tolerance;Decreased mobility;Pain       PT Treatment Interventions DME instruction;Gait training;Functional mobility training;Therapeutic activities;Patient/family education;Therapeutic exercise;Balance training    PT Goals (Current goals can be found in the Care Plan section)  Acute Rehab PT Goals Patient Stated Goal: be able to walk PT Goal Formulation: With patient Time For Goal Achievement: 03/29/17 Potential to Achieve Goals: Fair    Frequency Min 3X/week   Barriers to discharge Decreased caregiver support      Co-evaluation               AM-PAC PT "6 Clicks" Daily Activity  Outcome Measure Difficulty turning over in bed (including adjusting bedclothes, sheets and blankets)?: A Little Difficulty moving from lying on back to sitting on the side of the bed? : A Little Difficulty sitting down on and standing up from a chair with arms (e.g., wheelchair, bedside commode, etc,.)?: Unable Help needed moving to and from a bed to chair (including a wheelchair)?: A Little Help needed walking in hospital room?: Total Help needed climbing 3-5 steps with a railing? : Total 6 Click Score: 12    End of Session Equipment Utilized During Treatment: Gait belt Activity Tolerance: Patient limited by pain Patient left: in chair;with call bell/phone within reach;with chair alarm set;with nursing/sitter in room Nurse Communication: Mobility status PT Visit Diagnosis: History of falling (Z91.81);Unsteadiness on feet (R26.81);Pain;Muscle weakness (generalized) (M62.81);Difficulty in walking, not elsewhere classified (R26.2) Pain - Right/Left: Right Pain -  part of body: Leg    Time: 0912-0926 PT Time Calculation (min) (ACUTE ONLY): 14 min   Charges:   PT Evaluation $PT Eval Low Complexity: 1 Low     PT G Codes:          Tamala Ser 03/15/2017, 9:39 AM [phone]

## 2017-03-16 LAB — CBC
HCT: 29 % — ABNORMAL LOW (ref 39.0–52.0)
Hemoglobin: 8.6 g/dL — ABNORMAL LOW (ref 13.0–17.0)
MCH: 24.2 pg — ABNORMAL LOW (ref 26.0–34.0)
MCHC: 29.7 g/dL — AB (ref 30.0–36.0)
MCV: 81.5 fL (ref 78.0–100.0)
PLATELETS: 268 10*3/uL (ref 150–400)
RBC: 3.56 MIL/uL — ABNORMAL LOW (ref 4.22–5.81)
RDW: 16.9 % — AB (ref 11.5–15.5)
WBC: 4.3 10*3/uL (ref 4.0–10.5)

## 2017-03-16 LAB — BASIC METABOLIC PANEL
ANION GAP: 5 (ref 5–15)
BUN: 12 mg/dL (ref 6–20)
CHLORIDE: 103 mmol/L (ref 101–111)
CO2: 29 mmol/L (ref 22–32)
Calcium: 8.5 mg/dL — ABNORMAL LOW (ref 8.9–10.3)
Creatinine, Ser: 1.25 mg/dL — ABNORMAL HIGH (ref 0.61–1.24)
GFR, EST NON AFRICAN AMERICAN: 57 mL/min — AB (ref >60)
GLUCOSE: 99 mg/dL (ref 65–99)
POTASSIUM: 4.1 mmol/L (ref 3.5–5.1)
SODIUM: 137 mmol/L (ref 135–145)

## 2017-03-16 LAB — MAGNESIUM: Magnesium: 1.7 mg/dL (ref 1.7–2.4)

## 2017-03-16 NOTE — Progress Notes (Signed)
PROGRESS NOTE  Robert Romero [facility]  ZOX:[id number] DOB: 10-29-1945 DOA: 03/13/2017   PCP: Patient, No Pcp Per   Brief Narrative:  71 y/o male with history of HTN admitted to the hospital with right foot cellulitis. Recently was prescribed a course of keflex for UTI, but unclear if he took this. Started on vancomycin. Urine looks like it might be infected, urine culture has been sent. Clinically improving.  Assessment & Plan:  1. Right foot cellulitis. Failed outpatient antibiotic therapy. Started on vancomycin. Plain films of right foot do not raise concerns for underlying osteomyelitis. Continue Ancef and change to doxy in AM 2. HTN. Blood pressure is stable, keep on Norvasc  3. UTI. Urinalysis indicates possible UTI. Urine culture pending  4. Anemia of chronic disease, no evidence of active bleeding  5. Hypokalemia - WNL 6. Acute kidney injury - encourage oral intake, slightly better, BMP in AM  DVT prophylaxis: lovenox Code Status: full code Family Communication: pt at bedside  Disposition Plan: plan d/c SNF in AM  Consultants:   None  Procedures:   None  Antimicrobials:   Vancomycin 9/27 --> 9/29  Ancef 9/29 -->  Subjective: Pt reports feeling better but still with pain in the right foot.   Objective: Vitals:   03/15/17 0531 03/15/17 1342 03/15/17 2039 03/16/17 0548  BP: (!) 162/87 (!) 148/67 (!) 159/83 (!) 174/86  Pulse: 69 61 (!) 59 63  Resp: Temp: 98.2 F (36.8 C) 98.1 F (36.7 C) 98 F (36.7 C) 98 F (36.7 C)  TempSrc: Oral Oral Oral Oral  SpO2: 97% 100% 98% 97%  Weight:      Height:        Intake/Output Summary (Last 24 hours) at 03/16/17 1139 Last data filed at 03/16/17 0911  Gross per 24 hour  Intake          2461.25 ml  Output             4125 ml  Net         -1663.75 ml   Filed Weights   03/13/17 0532  Weight: 68 kg (150 lb)   Physical Exam  Constitutional: Appears well-developed and well-nourished. No distress.  CVS: RRR,  S1/S2 +, no murmurs, no gallops, no carotid bruit.  Pulmonary: Effort and breath sounds normal, no stridor, rhonchi, wheezes, rales.  Abdominal: Soft. BS +,  no distension, tenderness, rebound or guarding.  Musculoskeletal: Normal range of motion. R foot edema still note with erythema and TTP, overall better    Data Reviewed: I have personally reviewed following labs and imaging studies  CBC:  Recent Labs Lab 03/13/17 0851 03/14/17 0550 03/15/17 0522 03/16/17 0709  WBC 10.3 6.1 4.4 4.3  NEUTROABS 8.4*  --   --   --   HGB 10.3* 8.0* 8.0* 8.6*  HCT 34.5* 26.5* 26.9* 29.0*  MCV 79.7 81.3 80.5 81.5  PLT 331 232 233 268   Basic Metabolic Panel:  Recent Labs Lab 03/13/17 0851 03/13/17 1557 03/14/17 0550 03/15/17 0522 03/16/17 0709  NA 139  --  140 140 137  K 3.1*  --  3.2* 4.1 4.1  CL 104  --  109 107 103  CO2 27  --  GLUCOSE 121*  --  85 92 99  BUN 14  --  CREATININE 1.21  --  1.20 1.26* 1.25*  CALCIUM 9.1  --  8.3* 8.4* 8.5*  MG  --  1.7  --   --  1.7   Liver Function Tests:  Recent Labs Lab 03/13/17 0851 03/14/17 0550  AST 20 17  ALT 19 14*  ALKPHOS 120 82  BILITOT 0.8 0.4  PROT 8.3* 6.0*  ALBUMIN 4.1 2.8*   Cardiac Enzymes:  Recent Labs Lab 03/13/17 1557 03/13/17 2334  TROPONINI 0.03* 0.03*   Thyroid Function Tests:  Recent Labs  03/13/17 1557  TSH 1.603   Anemia Panel:  Recent Labs  03/14/17 1654  VITAMINB12 340  FOLATE 15.4  FERRITIN 27  TIBC 388  IRON 17*  RETICCTPCT 1.5   Radiology Studies: No results found.  Scheduled Meds: . amLODipine  10 mg Oral Daily  . enoxaparin (LOVENOX) injection  40 mg Subcutaneous Q24H   Continuous Infusions: . sodium chloride 75 mL/hr at 03/13/17 2219  .  ceFAZolin (ANCEF) IV Stopped (03/16/17 0714)     LOS: 3 days   Time spent: 25 minutes   Debbora Presto, MD [facility] Pager [phone]  If 7PM-7AM, please contact  night-coverage [email or web address] Password [email or web address] 03/16/2017, 11:39 AM

## 2017-03-16 NOTE — Evaluation (Signed)
Occupational Therapy Evaluation Patient Details Name: Robert Romero MRN: [id number] DOB: 05-09-1946 Today's Date: 03/16/2017    History of Present Illness 71 year old homeless male with history of 2 R hip fractures, CVA with R HP, hypertension, untreated, who presented to ED for worsening acute on chronic right hip pain, right ankle pain from a fall 14 days ago. Recently in the ED on 9/8, x-ray did not show any acute abnormalities except for Klebsiella UTI. Patient was discharged on Keflex. Patient is not sure if he completed his Keflex regimen. Presented with right foot is swollen, red, inflamed for the last two days.    Clinical Impression   Pt has been residing in a homeless shelter and reports sleeping in his w/c with his head on a table a there is not a bed for him. Pt with chronic R foot pain. Was w/c dependent prior to admission, but independent with transfers and self care. Pt needs min assist for standing and pivoting with RW. He does not weight bear on his R foot due to pain. Will follow acutely. Pt needs assistance for eventual long term placement in ILF or ALF.     Follow Up Recommendations  SNF    Equipment Recommendations       Recommendations for Other Services       Precautions / Restrictions Precautions Precautions: Fall Restrictions Weight Bearing Restrictions: No      Mobility Bed Mobility Overal bed mobility: Modified Independent             Transfers Overall transfer level: Needs assistance Equipment used: Rolling walker (2 wheeled) Transfers: Sit to/from Stand;Stand Pivot Transfers Sit to Stand: From elevated surface;Min assist Stand pivot transfers: Min assist       General transfer comment: min A to rise and pivot to recliner with RW, VCs hand placement, maintains RLE in NWB position 2* pain    Balance Overall balance assessment: Needs assistance   Sitting balance-Leahy Scale: Good     Standing balance support: Bilateral upper  extremity supported Standing balance-Leahy Scale: Poor Standing balance comment: requires BUE support, does not place R foot on floor                           ADL either performed or assessed with clinical judgement   ADL Overall ADL's : Needs assistance/impaired Eating/Feeding: Independent;Sitting Eating/Feeding Details (indicate cue type and reason): reports he self feeds with L hand Grooming: Wash/dry hands;Wash/dry face;Sitting;Set up   Upper Body Bathing: Set up;Sitting   Lower Body Bathing: Minimal assistance;Sit to/from stand   Upper Body Dressing : Set up;Sitting   Lower Body Dressing: Minimal assistance;Sit to/from stand   Toilet Transfer: Minimal assistance;Squat-pivot;RW   Toileting- Clothing Manipulation and Hygiene: Minimal assistance;Sit to/from stand       Functional mobility during ADLs:  (pt does not desire to walk) General ADL Comments: Pt report he performs self care from w/c level, leaning side to side. Performs squat pivot to toilet.     Vision Patient Visual Report: No change from baseline       Perception     Praxis      Pertinent Vitals/Pain Pain Assessment: Faces Faces Pain Scale: Hurts little more Pain Location: RLE Pain Descriptors / Indicators: Sore Pain Intervention(s): Monitored during session;Repositioned     Hand Dominance Right   Extremity/Trunk Assessment Upper Extremity Assessment Upper Extremity Assessment: RUE deficits/detail RUE Deficits / Details: h/o CVA 2 years ago, reports limited coordination  and strength in hand, full AROM RUE Coordination: decreased fine motor   Lower Extremity Assessment RLE Deficits / Details: pt guards RLE 2* pain, not able to tolerate ROM/strength assessment, used hands to help advance RLE to edge of bed, keeps RLE in NWB position in standing,  reports decreased sensation to light touch R foot   Cervical / Trunk Assessment Cervical / Trunk Assessment: Normal   Communication  Communication Communication: No difficulties   Cognition Arousal/Alertness: Awake/alert Behavior During Therapy: WFL for tasks assessed/performed Overall Cognitive Status: Within Functional Limits for tasks assessed                                     General Comments       Exercises     Shoulder Instructions      Home Living Family/patient expects to be discharged to:: Unsure                                 Additional Comments: pt was staying at homeless shelter [facility]      Prior Functioning/Environment Level of Independence: Independent with assistive device(s)        Comments: walked with rollator prior to ~1 month ago, has been using WC since a fall in earlier this month, WC is at shelter        OT Problem List: Pain;Impaired balance (sitting and/or standing)      OT Treatment/Interventions: Self-care/ADL training;DME and/or AE instruction;Therapeutic activities;Patient/family education    OT Goals(Current goals can be found in the care plan section) Acute Rehab OT Goals Patient Stated Goal: to find a place to live OT Goal Formulation: With patient Time For Goal Achievement: 03/23/17 Potential to Achieve Goals: Good ADL Goals Pt Will Perform Lower Body Bathing: with modified independence;sit to/from stand Pt Will Perform Lower Body Dressing: with modified independence;sit to/from stand Pt Will Transfer to Toilet: with modified independence;stand pivot transfer;regular height toilet  OT Frequency: Min 2X/week   Barriers to D/C: Decreased caregiver support          Co-evaluation              AM-PAC PT "6 Clicks" Daily Activity     Outcome Measure Help from another person eating meals?: None Help from another person taking care of personal grooming?: None Help from another person toileting, which includes using toliet, bedpan, or urinal?: A Little Help from another person bathing (including washing, rinsing, drying)?: A  Little Help from another person to put on and taking off regular upper body clothing?: None Help from another person to put on and taking off regular lower body clothing?: A Little 6 Click Score: 21   End of Session Equipment Utilized During Treatment: Rolling walker;Gait belt  Activity Tolerance: Patient limited by pain Patient left: in bed;with call bell/phone within reach  OT Visit Diagnosis: Unsteadiness on feet (R26.81);Pain Pain - Right/Left: Right Pain - part of body: Ankle and joints of foot                Time: 7829-5621 OT Time Calculation (min): 14 min Charges:  OT General Charges $OT Visit: 1 Visit OT Evaluation $OT Eval Moderate Complexity: 1 Mod G-Codes:     04/13/17 Martie Round, OTR/L Pager: [phone]  Iran Planas, [place] Bailiff 2017-04-13, 10:24 AM

## 2017-03-17 LAB — CBC
HEMATOCRIT: 30.7 % — AB (ref 39.0–52.0)
HEMOGLOBIN: 9.2 g/dL — AB (ref 13.0–17.0)
MCH: 24 pg — AB (ref 26.0–34.0)
MCHC: 30 g/dL (ref 30.0–36.0)
MCV: 80.2 fL (ref 78.0–100.0)
Platelets: 267 10*3/uL (ref 150–400)
RBC: 3.83 MIL/uL — AB (ref 4.22–5.81)
RDW: 16.9 % — ABNORMAL HIGH (ref 11.5–15.5)
WBC: 4.3 10*3/uL (ref 4.0–10.5)

## 2017-03-17 LAB — BASIC METABOLIC PANEL
Anion gap: 8 (ref 5–15)
BUN: 14 mg/dL (ref 6–20)
CHLORIDE: 103 mmol/L (ref 101–111)
CO2: 27 mmol/L (ref 22–32)
CREATININE: 1.22 mg/dL (ref 0.61–1.24)
Calcium: 8.9 mg/dL (ref 8.9–10.3)
GFR calc Af Amer: >60 mL/min (ref >60)
GFR calc non Af Amer: 59 mL/min — ABNORMAL LOW (ref >60)
GLUCOSE: 95 mg/dL (ref 65–99)
POTASSIUM: 3.6 mmol/L (ref 3.5–5.1)
SODIUM: 138 mmol/L (ref 135–145)

## 2017-03-17 LAB — URINE CULTURE: Culture: >=100000 — AB

## 2017-03-17 MED ORDER — TRAMADOL HCL 50 MG PO TABS: 50.0000 mg | ORAL_TABLET | Freq: Four times a day (QID) | ORAL | 0 refills | Status: AC | PRN

## 2017-03-17 MED ORDER — AMLODIPINE BESYLATE 10 MG PO TABS: 10.0000 mg | ORAL_TABLET | Freq: Every day | ORAL | 0 refills | Status: AC

## 2017-03-17 MED ORDER — AMOXICILLIN-POT CLAVULANATE 875-125 MG PO TABS: 1.0000 | ORAL_TABLET | Freq: Two times a day (BID) | ORAL | 0 refills | Status: AC

## 2017-03-17 MED ORDER — AMOXICILLIN-POT CLAVULANATE 875-125 MG PO TABS
1.0000 | ORAL_TABLET | Freq: Two times a day (BID) | ORAL | Status: DC
  Administered 2017-03-17: 1 via ORAL
  Filled 2017-03-17: qty 1

## 2017-03-17 MED ORDER — LEVALBUTEROL HCL 0.63 MG/3ML IN NEBU: 0.6300 mg | INHALATION_SOLUTION | Freq: Four times a day (QID) | RESPIRATORY_TRACT | 12 refills | Status: AC | PRN

## 2017-03-17 NOTE — NC FL2 (Signed)
[place] MEDICAID FL2 LEVEL OF CARE SCREENING TOOL     IDENTIFICATION  Patient Name: Robert Romero Birthdate: 09/21/1945 Sex: male Admission Date (Current Location): 03/13/2017  [facility] and [place] Number:  Producer, television/film/video and Address:  [facility],  [address], [place] [address]      Provider Number: [id number]  Attending Physician Name and Address:  Dorothea Ogle, MD  Relative Name and Phone Number:       Current Level of Care: Hospital Recommended Level of Care: Skilled Nursing Facility Prior Approval Number:    Date Approved/Denied:   PASRR Number:    Discharge Plan: SNF    Current Diagnoses: Patient Active Problem List   Diagnosis Date Noted  . Anemia 03/14/2017  . UTI (urinary tract infection) 03/14/2017  . Right foot infection 03/13/2017  . Cellulitis of right foot 03/13/2017  . Cellulitis of right lower extremity   . Essential hypertension     Orientation RESPIRATION BLADDER Height & Weight     Self, Time, Situation, Place  Normal Continent Weight: 150 lb (68 kg) Height:   (188 cm)  BEHAVIORAL SYMPTOMS/MOOD NEUROLOGICAL BOWEL NUTRITION STATUS      Continent Diet  AMBULATORY STATUS COMMUNICATION OF NEEDS Skin   Extensive Assist Verbally  (Celluitis of Right Foot )                       Personal Care Assistance Level of Assistance  Bathing, Feeding, Dressing Bathing Assistance: Limited assistance Feeding assistance: Independent Dressing Assistance: Limited assistance     Functional Limitations Info  Sight, Hearing, Speech Sight Info: Adequate Hearing Info: Adequate Speech Info: Adequate    SPECIAL CARE FACTORS FREQUENCY  PT (By licensed PT)     PT Frequency: 5x/week              Contractures Contractures Info: Not present    Additional Factors Info  Code Status, Allergies Code Status Info: Fullcode Allergies Info: No Known Allergies           Current Medications (03/17/2017):  This is  the current hospital active medication list Current Facility-Administered Medications  Medication Dose Route Frequency Provider Last Rate Last Dose  . acetaminophen (TYLENOL) tablet 650 mg  650 mg Oral Q6H PRN Richarda Overlie, MD   650 mg at 03/14/17 0951   Or  . acetaminophen (TYLENOL) suppository 650 mg  650 mg Rectal Q6H PRN Richarda Overlie, MD      . amLODipine (NORVASC) tablet 10 mg  10 mg Oral Daily Richarda Overlie, MD   10 mg at 03/16/17 1027  . amoxicillin-clavulanate (AUGMENTIN) 875-125 MG per tablet 1 tablet  1 tablet Oral Q12H Dorothea Ogle, MD      . enoxaparin (LOVENOX) injection 40 mg  40 mg Subcutaneous Q24H Richarda Overlie, MD   40 mg at 03/16/17 1945  . hydrALAZINE (APRESOLINE) injection 10 mg  10 mg Intravenous Q6H PRN Erick Blinks, MD      . levalbuterol (XOPENEX) nebulizer solution 0.63 mg  0.63 mg Nebulization Q6H PRN Richarda Overlie, MD      . ondansetron (ZOFRAN) tablet 4 mg  4 mg Oral Q6H PRN Richarda Overlie, MD       Or  . ondansetron (ZOFRAN) injection 4 mg  4 mg Intravenous Q6H PRN Abrol, Germain Osgood, MD      . traMADol (ULTRAM) tablet 50 mg  50 mg Oral Q6H PRN Erick Blinks, MD   50 mg at  03/16/17 2139     Discharge Medications: Please see discharge summary for a list of discharge medications.  Relevant Imaging Results:  Relevant Lab Results:   Additional Information SSN:  Clearance Coots, LCSW

## 2017-03-17 NOTE — Progress Notes (Signed)
Date: March 17, 2017 Discharge orders review for case management needs.  None found  Per patient or family member no additional needs at home. Rhonda Davis, BSN, RN3, CCM:  [phone] 

## 2017-03-17 NOTE — Clinical Social Work Placement (Addendum)
[facility] scheduled for transport.   CLINICAL SOCIAL WORK PLACEMENT  NOTE  Date:  03/17/2017  Patient Details  Name: Robert Romero MRN: [id number] Date of Birth: 04/29/1946  Clinical Social Work is seeking post-discharge placement for this patient at the Skilled  Nursing Facility level of care (*CSW will initial, date and re-position this form in  chart as items are completed):  Yes   Patient/family provided with [facility] Clinical Social Work Department's list of facilities offering this level of care within the geographic area requested by the patient (or if unable, by the patient's family).  Yes   Patient/family informed of their freedom to choose among providers that offer the needed level of care, that participate in Medicare, Medicaid or managed care program needed by the patient, have an available bed and are willing to accept the patient.  Yes   Patient/family informed of [facility]'s ownership interest in [facility] and [facility], as well as of the fact that they are under no obligation to receive care at these facilities.  PASRR submitted to EDS on 03/17/17     PASRR number received on 03/17/17     Existing PASRR number confirmed on       FL2 transmitted to all facilities in geographic area requested by pt/family on       FL2 transmitted to all facilities within larger geographic area on       Patient informed that his/her managed care company has contracts with or will negotiate with certain facilities, including the following:        Yes   Patient/family informed of bed offers received.  Patient chooses bed at [facility]     Physician recommends and patient chooses bed at [facility]    Patient to be transferred to [facility] on 03/17/17.  Patient to be transferred to facility by PTAR     Patient family notified on 03/17/17 of transfer.  Name of family member notified:  No family.      PHYSICIAN       Additional  Comment:    _______________________________________________ Clearance Coots, LCSW 03/17/2017, 1:37 PM

## 2017-03-17 NOTE — Progress Notes (Signed)
Reported called to [facility] health and Rehab.

## 2017-03-17 NOTE — Discharge Instructions (Signed)

## 2017-03-17 NOTE — Clinical Social Work Note (Signed)
Clinical Social Work Assessment  Patient Details  Name: Robert Romero MRN: [id number] Date of Birth: 19-Nov-1945  Date of referral:  03/17/17               Reason for consult:  Facility Placement                Permission sought to share information with:  Chartered certified accountant granted to share information::  Yes, Verbal Permission Granted  Name::        Agency::     Relationship::     Contact Information:     Housing/Transportation Living arrangements for the past 2 months:  Homeless, Apartment Source of Information:  Patient Patient Interpreter Needed:  None Criminal Activity/Legal Involvement Pertinent to Current Situation/Hospitalization:  No - Comment as needed Significant Relationships:  Siblings Lives with:   (Homeless ) Do you feel safe going back to the place where you live?  Yes Need for family participation in patient care:  No (Coment)  Care giving concerns:  SNF placement for short rehab stay.    Social Worker assessment / plan:  CSW met with patient at bedside, explain role and reason for visit to assist d/c plannign to SNF. Patient alert , oriented and agreeable. Patient reports he was living in [place] and recently decided to move back to [place] with his sister. Patient report on the train in route to [place] he urinated on self and "put off the train." Patient report he was brought to hospital. Patient has difficulty walking and PT recommends SNF placement.  Patient is has not been able to contact his sister.  Patient reports he lost his identification/debit card.  Patient agreeable to be faxed out. FL2 Completed. PASRR received.   Employment status:  Retired Forensic scientist:  Information systems manager, Catering manager PT Recommendations:  [place] / Referral to community resources:  [place]  Patient/Family's Response to care:  Agreeable and Responding well to care. Patient appreciative of CSW  services.   Patient/Family's Understanding of and Emotional Response to Diagnosis, Current Treatment, and Prognosis:  No family at bedside. " I want to get stronger and be able to ride train to [place]. "  Emotional Assessment Appearance:  Appears stated age Attitude/Demeanor/Rapport:    Affect (typically observed):  Calm, Accepting Orientation:  Oriented to Self, Oriented to Place, Oriented to  Time, Oriented to Situation Alcohol / Substance use:  Not Applicable Psych involvement (Current and /or in the community):  No (Comment)  Discharge Needs  Concerns to be addressed:  Discharge Planning Concerns, Care Coordination Readmission within the last 30 days:  No Current discharge risk:  Dependent with Mobility Barriers to Discharge:  No Barriers Identified   Lia Hopping, LCSW 03/17/2017, 9:39 AM

## 2017-03-17 NOTE — Discharge Summary (Signed)
Physician Discharge Summary  Blakely Gluth [place] NFA:[id number] DOB: 05-08-1946 DOA: 03/13/2017  PCP: Patient, No Pcp Per  Admit date: 03/13/2017 Discharge date: 03/17/2017  Recommendations for Outpatient Follow-up:  1. Pt will need to follow up with PCP in 2-3 weeks post discharge 2. Please obtain BMP to evaluate electrolytes and kidney function 3. Please also check CBC to evaluate Hg and Hct levels 4. Cont Augmentin for 5 more days  Discharge Diagnoses:  Principal Problem:   Right foot infection Active Problems:   Essential hypertension   Cellulitis of right foot   Anemia   UTI (urinary tract infection)  Discharge Condition: Stable  Diet recommendation: Heart healthy diet discussed in details   History of present illness:  71 y/o male with history of HTN admitted to the hospital with right foot cellulitis. Recently was prescribed a course of keflex for UTI, but unclear if he took this. Started on vancomycin. Urine looks like it might be infected, urine culture has been sent. Clinically improving.  Assessment & Plan:  1. Right foot cellulitis. Failed outpatient antibiotic therapy. Started on vancomycin. Plain films of right foot do not raise concerns for underlying osteomyelitis. Transitioned to Ancef. Improving, change to Augmentin po today to complete for 5 more days post discharge. 2. HTN. Blood pressure is stable, keep on Norvasc  3. UTI. Urinalysis indicates possible UTI. Urine culture with klebsiella, Augmentin should be adequate  4. Anemia of chronic disease, no evidence of active bleeding  5. Hypokalemia - WNL 6. Acute kidney injury resolved with IVF  DVT prophylaxis: lovenox Code Status: full code Family Communication: pt at bedside  Disposition Plan: SNF  Consultants:   None  Procedures:   None  Antimicrobials:   Vancomycin 9/27 --> 9/29  Ancef 9/29 --> 10/01  Augmentin on discharge for 5 more days   Procedures/Studies: Ct Head Wo  Contrast  Result Date: 02/21/2017 CLINICAL DATA:  Unable to ambulate. Hypertension. Poor p.o. intake. EXAM: CT HEAD WITHOUT CONTRAST TECHNIQUE: Contiguous axial images were obtained from the base of the skull through the vertex without intravenous contrast. COMPARISON:  None. FINDINGS: Brain: There is mild generalized age-related parenchymal atrophy with commensurate dilatation of the ventricles and sulci. Chronic small vessel ischemic changes noted within the bilateral periventricular and subcortical white matter regions. 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 There is no mass, hemorrhage, edema or other evidence of acute parenchymal abnormality. No extra-axial hemorrhage. Vascular: There are chronic calcified atherosclerotic changes of the large vessels at the skull base. No unexpected hyperdense vessel. Skull: Normal. Negative for fracture or focal lesion. Sinuses/Orbits: No acute finding. Other: None. IMPRESSION: 1. No acute findings.  No intracranial mass, hemorrhage or edema. 2. Chronic small vessel ischemic changes in the white matter and right thalamus. Electronically Signed   By: Bary Richard M.D.   On: 02/21/2017 14:57   Dg Foot Complete Right  Result Date: 03/13/2017 CLINICAL DATA:  Swollen inflamed appearing right foot for the past 2 days. History of previous CVA, current smoker. EXAM: RIGHT FOOT COMPLETE - 3+ VIEW COMPARISON:  None in PACs FINDINGS: The bones are osteopenic. The phalanges and metatarsals appear intact. The tarsal bones also appear intact. The patient has undergone previous ORIF for medial malleolar and distal fibular fractures. There are no plain radiographic findings to suggest osteomyelitis. There is diffuse soft tissue swelling. IMPRESSION: No definite acute fracture or dislocation is observed. There is diffuse soft tissue swelling without radiographic evidence of osteomyelitis. Electronically Signed   By: Onalee Hua  [place] M.D.   On: 03/13/2017 08:35   Dg Hip  Unilat With Pelvis 2-3 Views Right  Result Date: 02/21/2017 CLINICAL DATA:  Right hip pain after fall several days ago. EXAM: DG HIP (WITH OR WITHOUT PELVIS) 2-3V RIGHT COMPARISON:  None. FINDINGS: Status post surgical internal fixation of old proximal femoral neck fracture. Severe degenerative joint disease is seen involving the right hip joint. Vascular calcifications are noted. No evidence of acute fracture or dislocation is seen involving the right femur. IMPRESSION: Status post surgical internal fixation of old proximal femoral neck fracture. No acute right femur fracture is noted. 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 Electronically Signed   By: Lupita Raider, M.D.   On: 02/21/2017 15:10   Dg Femur 1v Right  Result Date: 02/21/2017 CLINICAL DATA:  Right hip pain after fall several days ago. EXAM: RIGHT FEMUR 1 VIEW COMPARISON:  None. FINDINGS: Severe degenerative joint disease of the right hip joint is noted. Status post surgical internal fixation of old proximal right femoral neck fracture. No acute fracture or dislocation is noted. IMPRESSION: Severe degenerative joint disease of the right hip. Status post surgical internal fixation of old proximal right femoral neck fracture. No acute abnormality is noted. Electronically Signed   By: Lupita Raider, M.D.   On: 02/21/2017 15:08   Discharge Exam: Vitals:   03/16/17 2121 03/17/17 0541  BP: (!) 153/85 (!) 165/78  Pulse: 64 65  Resp: 16 16  Temp: 98.1 F (36.7 C) 98.1 F (36.7 C)  SpO2: 99% 97%   Vitals:   03/16/17 0548 03/16/17 1335 03/16/17 2121 03/17/17 0541  BP: (!) 174/86 (!) 163/82 (!) 153/85 (!) 165/78  Pulse: 63 60 64 65  Resp: Temp: 98 F (36.7 C) 97.9 F (36.6 C) 98.1 F (36.7 C) 98.1 F (36.7 C)  TempSrc: Oral Oral Oral Oral  SpO2: 97% 98% 99% 97%  Weight:      Height:        General: Pt is alert, follows commands appropriately, not in acute distress Cardiovascular: Regular rate and  rhythm, S1/S2 +, no murmurs, no rubs, no gallops Respiratory: Clear to auscultation bilaterally, no wheezing, no crackles, no rhonchi Abdominal: Soft, non tender, non distended, bowel sounds +, no guarding Extremities: no edema, no cyanosis, pulses palpable bilaterally DP and PT Neuro: Grossly nonfocal  Discharge Instructions  Discharge Instructions    Diet - low sodium heart healthy    Complete by:  As directed    Increase activity slowly    Complete by:  As directed      Allergies as of 03/17/2017   No Known Allergies     Medication List    TAKE these medications   amLODipine 10 MG tablet Commonly known as:  NORVASC Take 1 tablet (10 mg total) by mouth daily. What changed:  medication strength  how much to take   amoxicillin-clavulanate 875-125 MG tablet Commonly known as:  AUGMENTIN Take 1 tablet by mouth every 12 (twelve) hours.   levalbuterol 0.63 MG/3ML nebulizer solution Commonly known as:  XOPENEX Take 3 mLs (0.63 mg total) by nebulization every 6 (six) hours as needed for wheezing or shortness of breath.   traMADol 50 MG tablet Commonly known as:  ULTRAM Take 1 tablet (50 mg total) by mouth every 6 (six) hours as needed for moderate pain.            Discharge Care Instructions  Start     Ordered   03/17/17 0000  amLODipine (NORVASC) 10 MG tablet  Daily     03/17/17 1003   03/17/17 0000  amoxicillin-clavulanate (AUGMENTIN) 875-125 MG tablet  Every 12 hours     03/17/17 1003   03/17/17 0000  levalbuterol (XOPENEX) 0.63 MG/3ML nebulizer solution  Every 6 hours PRN     03/17/17 1003   03/17/17 0000  traMADol (ULTRAM) 50 MG tablet  Every 6 hours PRN     03/17/17 1003   03/17/17 0000  Increase activity slowly     03/17/17 1003   03/17/17 0000  Diet - low sodium heart healthy     03/17/17 1003     Follow-up Information    Placey, Chales Abrahams, NP Follow up.   Why:  Please follow up at the [facility].  Maybe Jinny Sanders can  assist help you with getting to this location for further help. Contact information: [address] [place] [address] [phone]            The results of significant diagnostics from this hospitalization (including imaging, microbiology, ancillary and laboratory) are listed below for reference.     Microbiology: Recent Results (from the past 240 hour(s))  Culture, Urine     Status: Abnormal   Collection Time: 03/14/17  3:29 PM  Result Value Ref Range Status   Specimen Description URINE, RANDOM  Final   Special Requests NONE  Final   Culture >=100,000 COLONIES/mL KLEBSIELLA PNEUMONIAE (A)  Final   Report Status 03/17/2017 FINAL  Final   Organism ID, Bacteria KLEBSIELLA PNEUMONIAE (A)  Final      Susceptibility   Klebsiella pneumoniae - MIC*    AMPICILLIN RESISTANT Resistant     CEFAZOLIN <=4 SENSITIVE Sensitive     CEFTRIAXONE <=1 SENSITIVE Sensitive     CIPROFLOXACIN <=0.25 SENSITIVE Sensitive     GENTAMICIN <=1 SENSITIVE Sensitive     IMIPENEM <=0.25 SENSITIVE Sensitive     NITROFURANTOIN <=16 SENSITIVE Sensitive     TRIMETH/SULFA <=20 SENSITIVE Sensitive     AMPICILLIN/SULBACTAM <=2 SENSITIVE Sensitive     PIP/TAZO <=4 SENSITIVE Sensitive     Extended ESBL NEGATIVE Sensitive     * >=100,000 COLONIES/mL KLEBSIELLA PNEUMONIAE     Labs: Basic Metabolic Panel:  Recent Labs Lab 03/13/17 0851 03/13/17 1557 03/14/17 0550 03/15/17 0522 03/16/17 0709 03/17/17 0623  NA 139  --  140 140 137 138  K 3.1*  --  3.2* 4.1 4.1 3.6  CL 104  --  109 107 103 103  CO2 27  --  GLUCOSE 121*  --  85 92 99 95  BUN 14  --  CREATININE 1.21  --  1.20 1.26* 1.25* 1.22  CALCIUM 9.1  --  8.3* 8.4* 8.5* 8.9  MG  --  1.7  --   --  1.7  --    Liver Function Tests:  Recent Labs Lab 03/13/17 0851 03/14/17 0550  AST 20 17  ALT 19 14*  ALKPHOS 120 82  BILITOT 0.8 0.4  PROT 8.3* 6.0*  ALBUMIN 4.1 2.8*   CBC:  Recent Labs Lab 03/13/17 0851  03/14/17 0550 03/15/17 0522 03/16/17 0709 03/17/17 0623  WBC 10.3 6.1 4.4 4.3 4.3  NEUTROABS 8.4*  --   --   --   --   HGB 10.3* 8.0* 8.0* 8.6* 9.2*  HCT 34.5* 26.5* 26.9* 29.0* 30.7*  MCV  79.7 81.3 80.5 81.5 80.2  PLT 331 232 233 268 267   Cardiac Enzymes:  Recent Labs Lab 03/13/17 1557 03/13/17 2334  TROPONINI 0.03* 0.03*    SIGNED: Time coordinating discharge: 60 minutes  Debbora Presto, MD  [facility] 03/17/2017, 10:03 AM Pager [phone]  If 7PM-7AM, please contact night-coverage [email or web address] Password [email or web address]

## 2017-03-17 NOTE — NC FL2 (Deleted)
[place] MEDICAID FL2 LEVEL OF CARE SCREENING TOOL     IDENTIFICATION  Patient Name: Robert Romero Birthdate: 1945-11-03 Sex: male Admission Date (Current Location): 03/13/2017  [facility] and [place] Number:  Producer, television/film/video and Address:  [facility],  [address] [place], [place] [address]      Provider Number: [id number]  Attending Physician Name and Address:  Dorothea Ogle, MD  Relative Name and Phone Number:       Current Level of Care: Hospital Recommended Level of Care: Skilled Nursing Facility Prior Approval Number:    Date Approved/Denied:   PASRR Number: [id number]  Discharge Plan: SNF    Current Diagnoses: Patient Active Problem List   Diagnosis Date Noted  . Anemia 03/14/2017  . UTI (urinary tract infection) 03/14/2017  . Right foot infection 03/13/2017  . Cellulitis of right foot 03/13/2017  . Cellulitis of right lower extremity   . Essential hypertension     Orientation RESPIRATION BLADDER Height & Weight     Self, Time, Situation, Place  Normal Continent Weight: 150 lb (68 kg) Height:   (188 cm)  BEHAVIORAL SYMPTOMS/MOOD NEUROLOGICAL BOWEL NUTRITION STATUS      Continent Diet  AMBULATORY STATUS COMMUNICATION OF NEEDS Skin   Extensive Assist Verbally  (Celluitis of Right Foot )                       Personal Care Assistance Level of Assistance  Bathing, Feeding, Dressing Bathing Assistance: Limited assistance Feeding assistance: Independent Dressing Assistance: Limited assistance     Functional Limitations Info  Sight, Hearing, Speech Sight Info: Adequate Hearing Info: Adequate Speech Info: Adequate    SPECIAL CARE FACTORS FREQUENCY  PT (By licensed PT)     PT Frequency: 5x/week              Contractures Contractures Info: Not present    Additional Factors Info  Code Status, Allergies Code Status Info: Fullcode Allergies Info: No Known Allergies           Current Medications (03/17/2017):   This is the current hospital active medication list Current Facility-Administered Medications  Medication Dose Route Frequency Provider Last Rate Last Dose  . acetaminophen (TYLENOL) tablet 650 mg  650 mg Oral Q6H PRN Richarda Overlie, MD   650 mg at 03/14/17 0951   Or  . acetaminophen (TYLENOL) suppository 650 mg  650 mg Rectal Q6H PRN Richarda Overlie, MD      . amLODipine (NORVASC) tablet 10 mg  10 mg Oral Daily Richarda Overlie, MD   10 mg at 03/16/17 1027  . amoxicillin-clavulanate (AUGMENTIN) 875-125 MG per tablet 1 tablet  1 tablet Oral Q12H Dorothea Ogle, MD      . enoxaparin (LOVENOX) injection 40 mg  40 mg Subcutaneous Q24H Richarda Overlie, MD   40 mg at 03/16/17 1945  . hydrALAZINE (APRESOLINE) injection 10 mg  10 mg Intravenous Q6H PRN Erick Blinks, MD      . levalbuterol (XOPENEX) nebulizer solution 0.63 mg  0.63 mg Nebulization Q6H PRN Richarda Overlie, MD      . ondansetron (ZOFRAN) tablet 4 mg  4 mg Oral Q6H PRN Richarda Overlie, MD       Or  . ondansetron (ZOFRAN) injection 4 mg  4 mg Intravenous Q6H PRN Richarda Overlie, MD      . traMADol (ULTRAM) tablet 50 mg  50 mg Oral Q6H PRN Erick Blinks, MD   50 mg at 03/16/17  2139     Discharge Medications: Please see discharge summary for a list of discharge medications.  Relevant Imaging Results:  Relevant Lab Results:   Additional Information [id number]  Clearance Coots, LCSW

## 2017-03-17 NOTE — Progress Notes (Signed)
Report called to [facility]

## 2018-11-25 IMAGING — CR DG HIP (WITH OR WITHOUT PELVIS) 2-3V*R*
3 series · 3 of 3 positions shown · non-contrast
Comparison: None.

CLINICAL DATA: Right hip pain after fall several days ago.

EXAM:
DG HIP (WITH OR WITHOUT PELVIS) 2-3V RIGHT

[t femur proximal ap right]
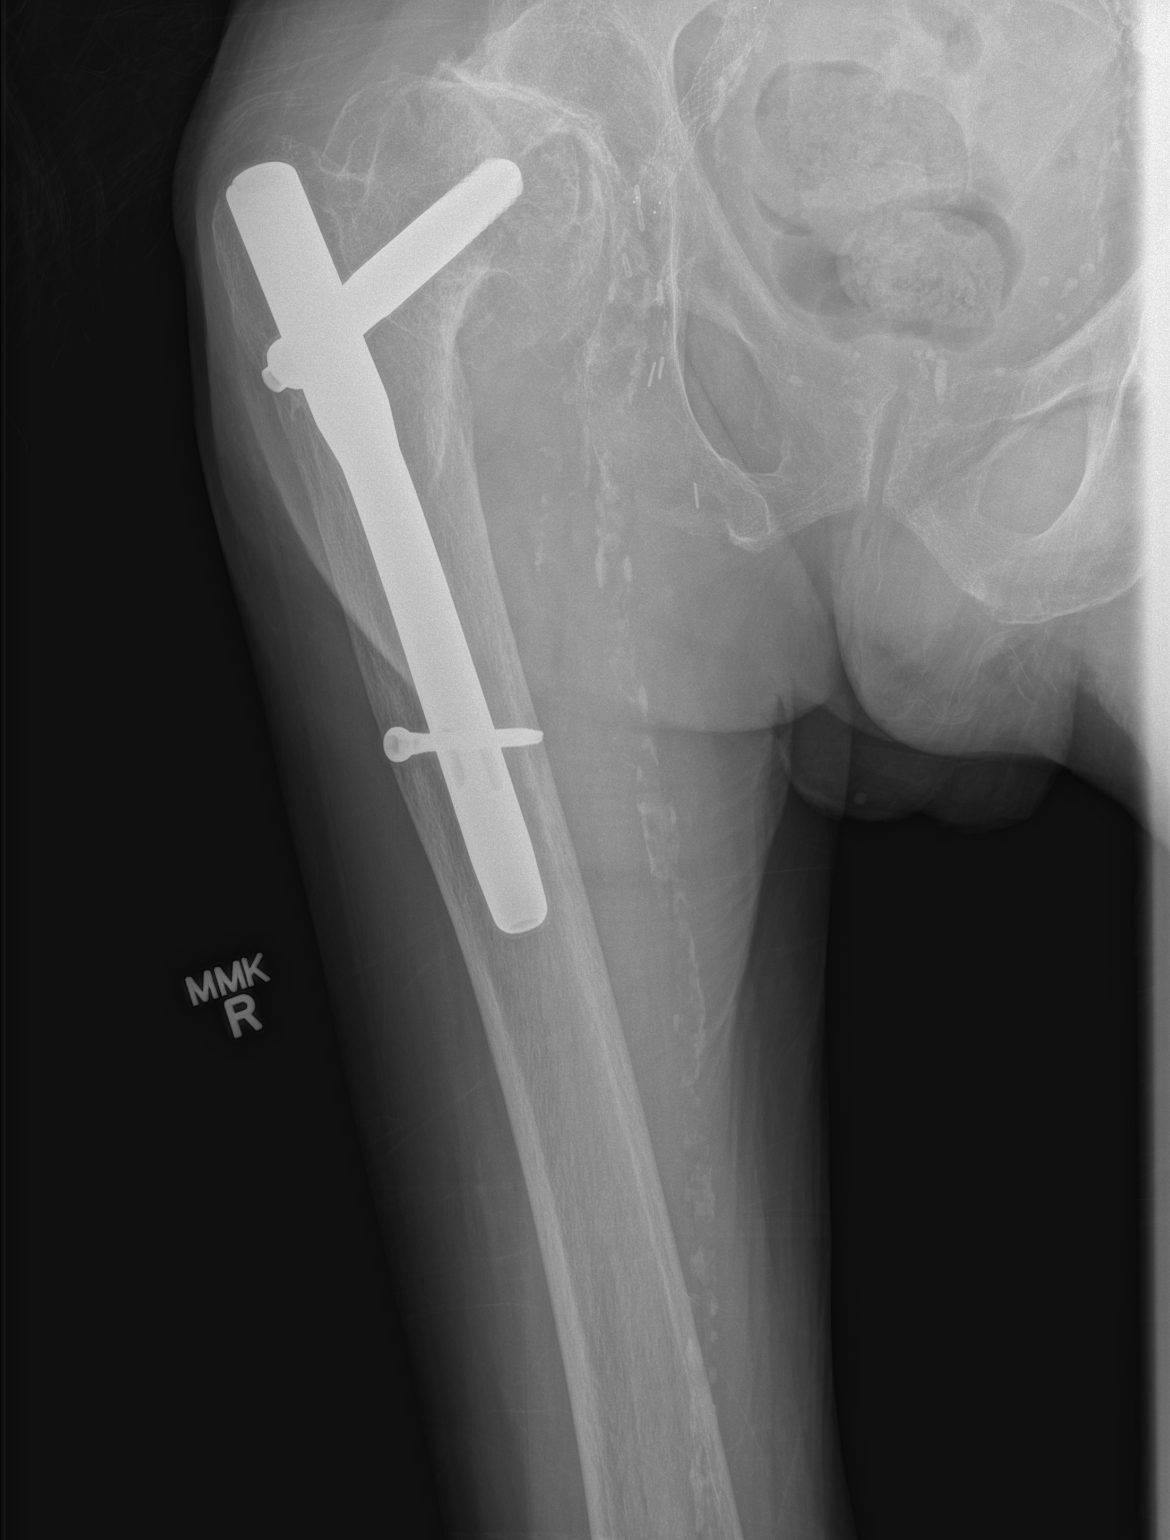

[t femur distal ap right]
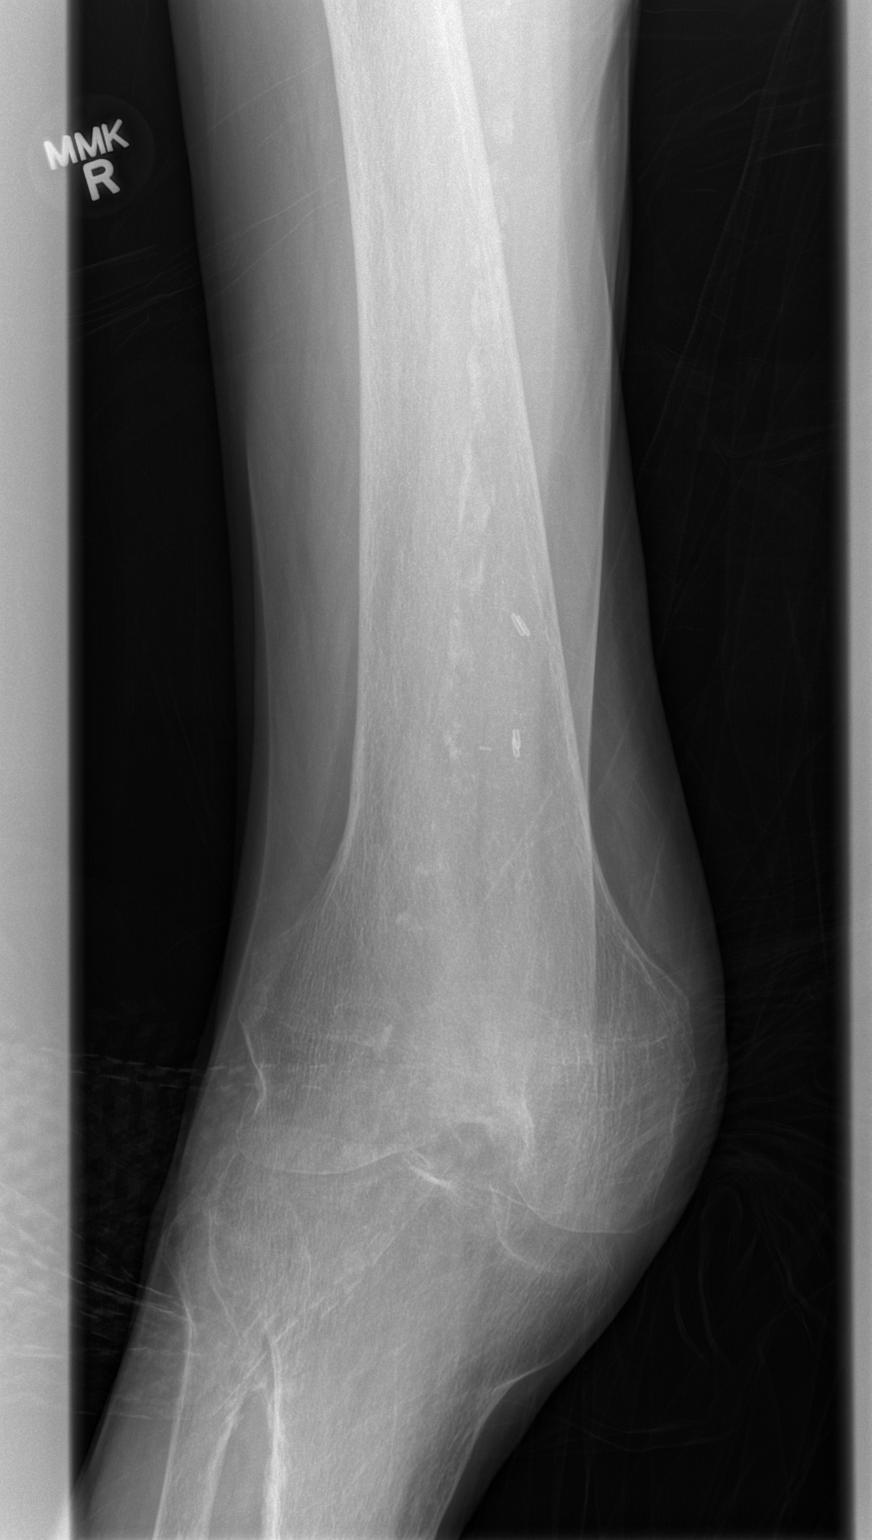

[x femur distal lat right]
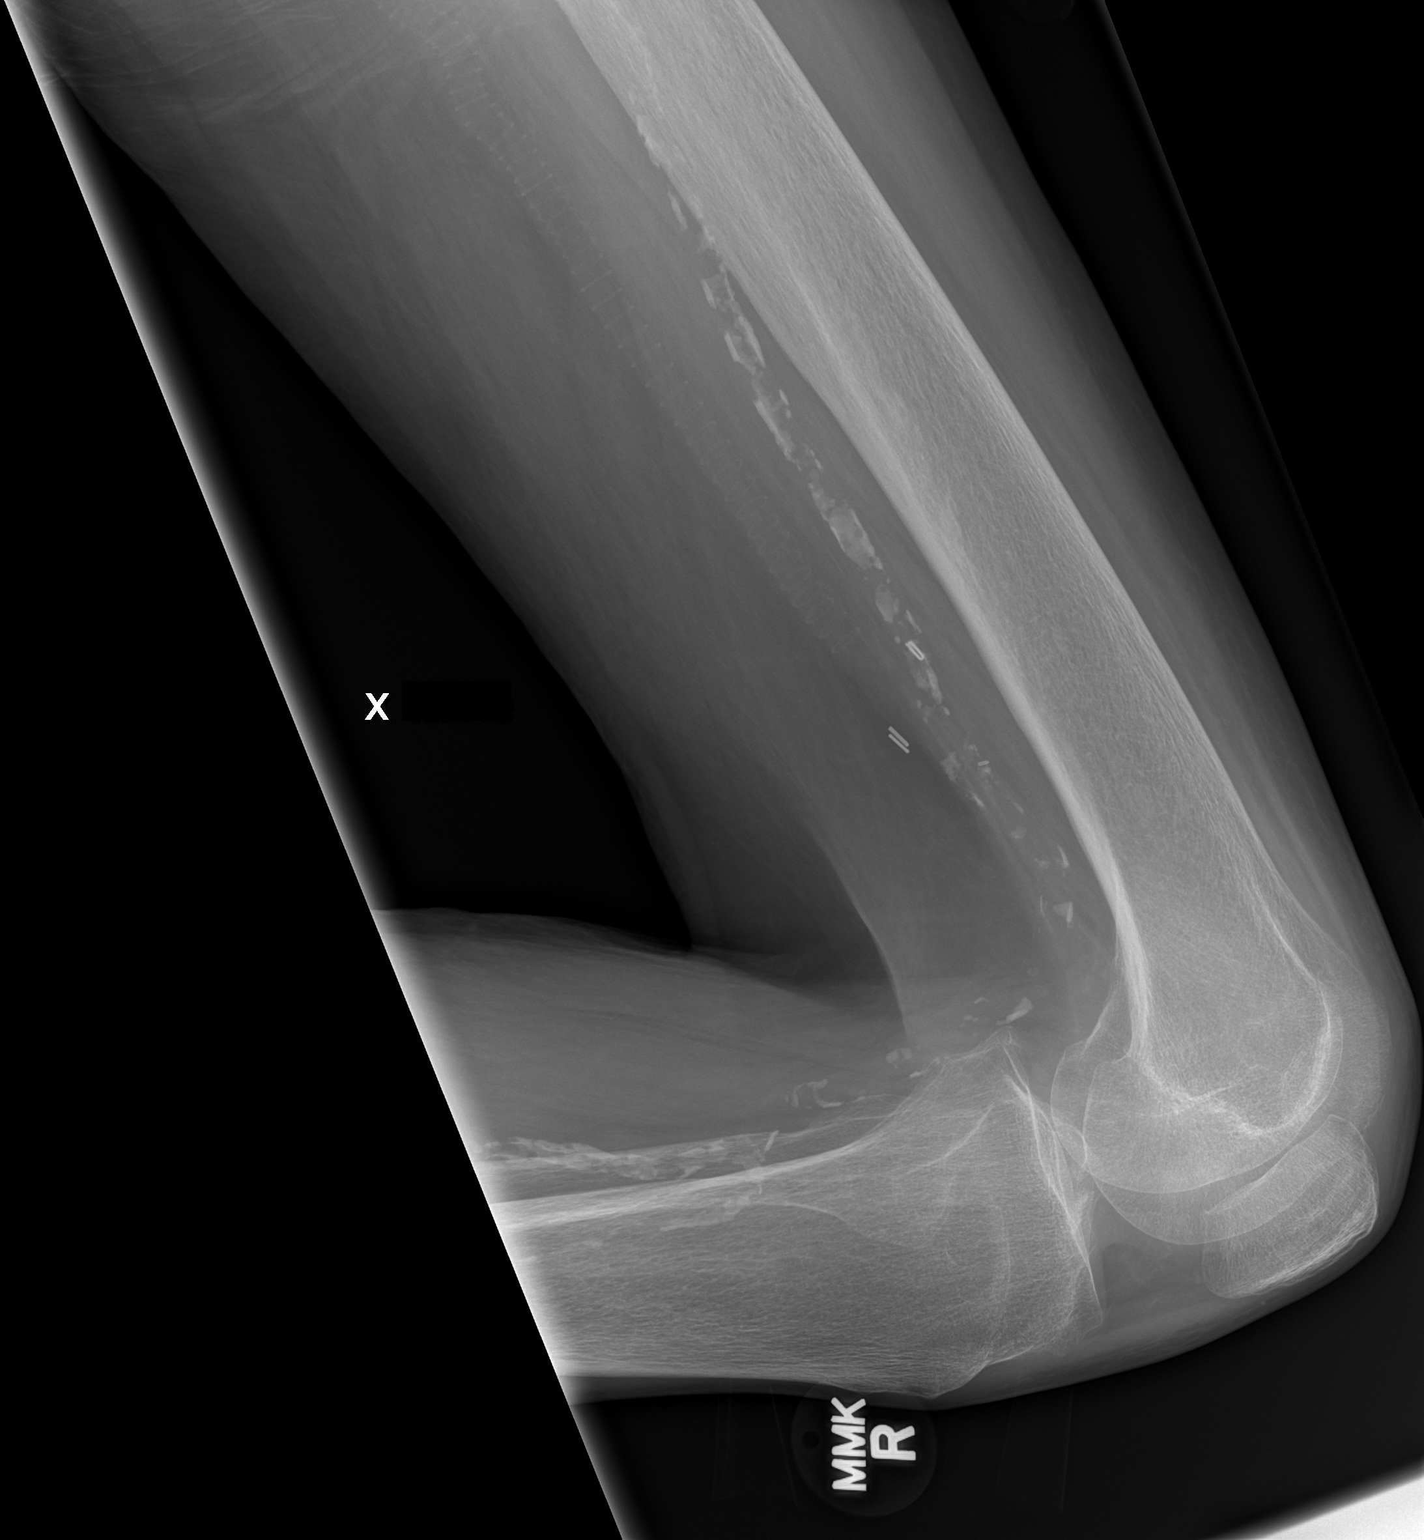

[3 of 3 positions shown; findings below may reference images not displayed]

FINDINGS: Status post surgical internal fixation of old proximal femoral neck
fracture. Severe degenerative joint disease is seen involving the
right hip joint. Vascular calcifications are noted. No evidence of
acute fracture or dislocation is seen involving the right femur.
IMPRESSION: Status post surgical internal fixation of old proximal femoral neck
fracture. No acute right femur fracture is noted. Severe
degenerative joint disease of right hip is noted.

## 2018-12-15 IMAGING — CR DG FOOT COMPLETE 3+V*R*
3 series · 3 of 3 positions shown · non-contrast
Comparison: None in PACs

CLINICAL DATA: Swollen inflamed appearing right foot for the past 2
days. History of previous CVA, current smoker.

EXAM:
RIGHT FOOT COMPLETE - 3+ VIEW

[x foot ap right]
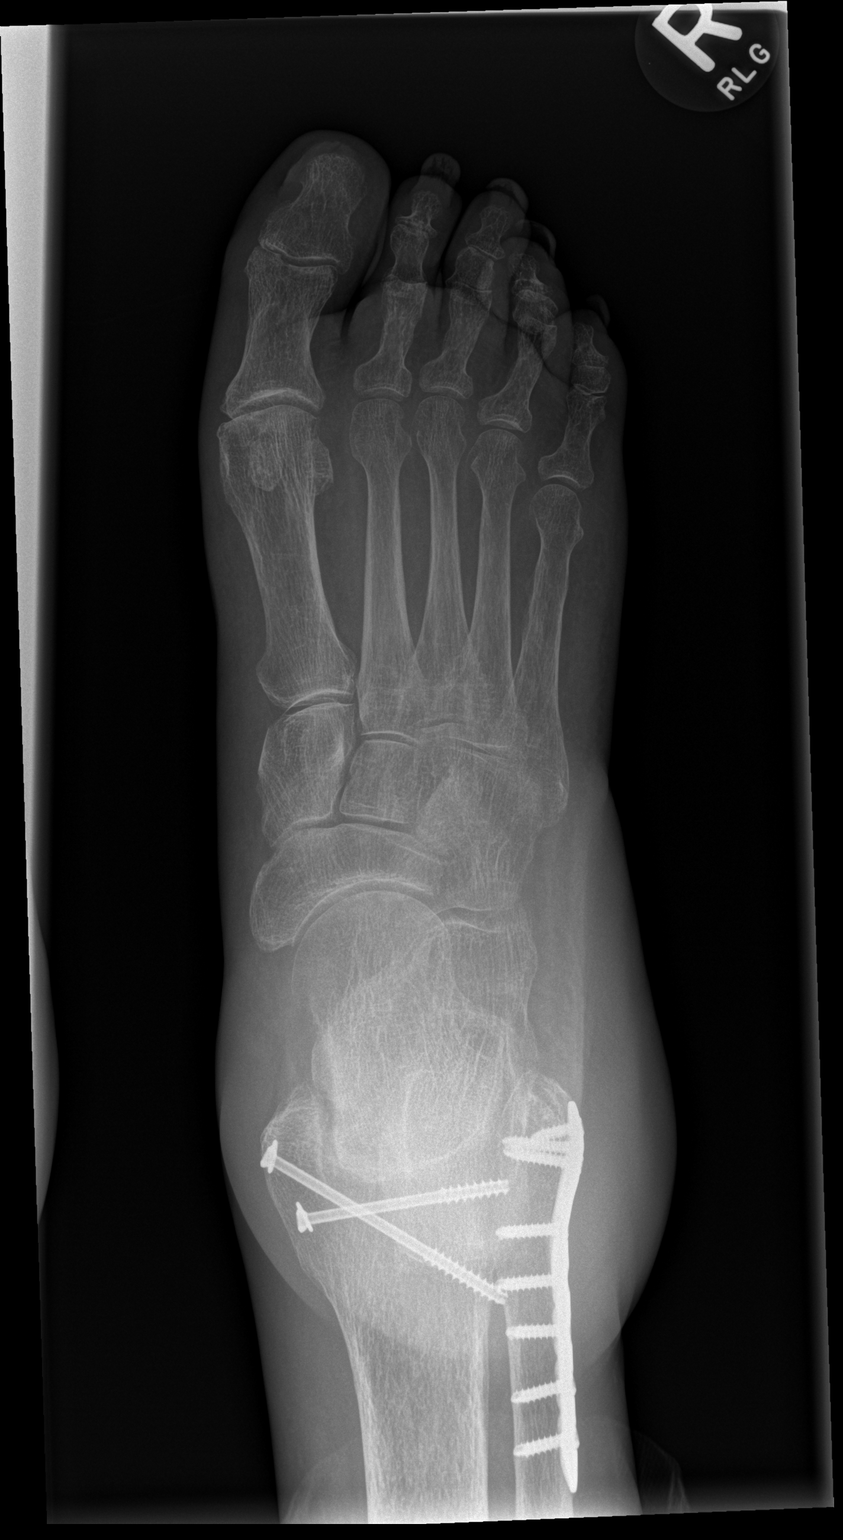

[x foot obl right]
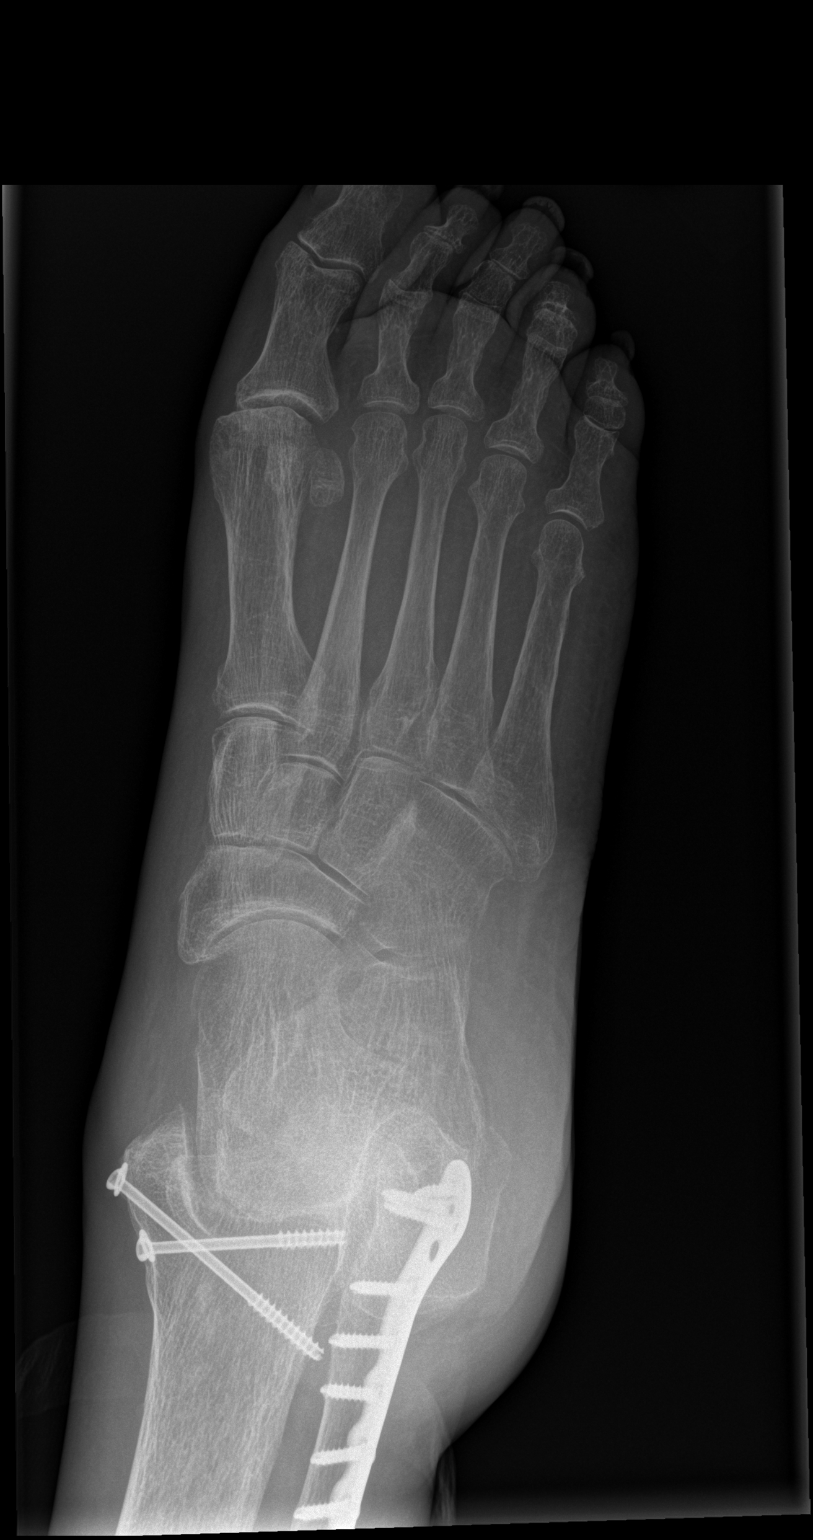

[x foot lat right]
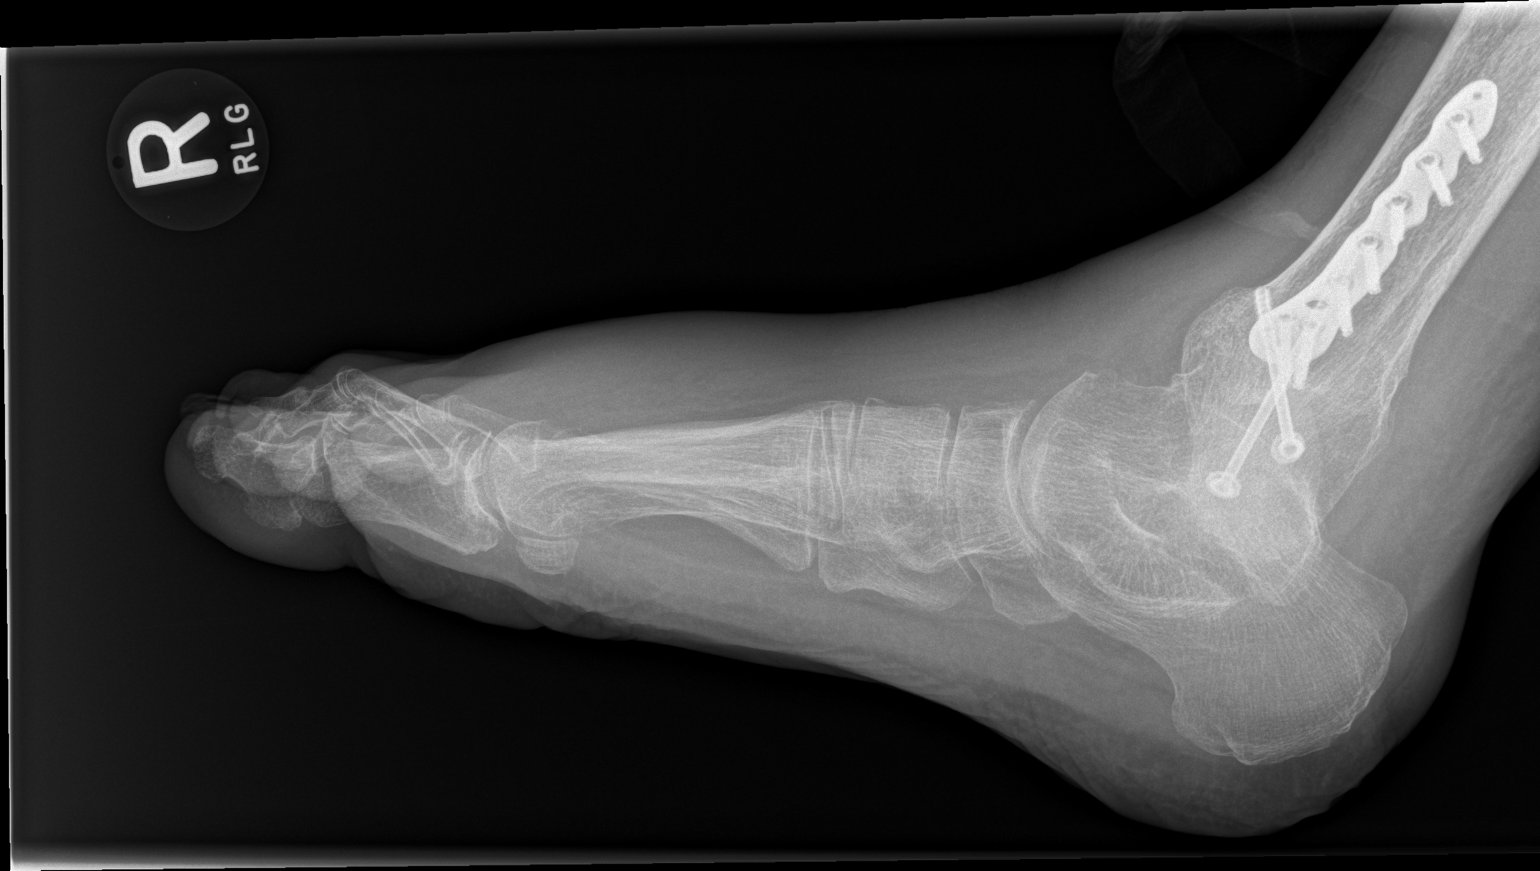

[3 of 3 positions shown; findings below may reference images not displayed]

FINDINGS: The bones are osteopenic. The phalanges and metatarsals appear
intact. The tarsal bones also appear intact. The patient has
undergone previous ORIF for medial malleolar and distal fibular
fractures. There are no plain radiographic findings to suggest
osteomyelitis. There is diffuse soft tissue swelling.
IMPRESSION: No definite acute fracture or dislocation is observed. There is
diffuse soft tissue swelling without radiographic evidence of
osteomyelitis.
